# Patient Record
Sex: Female | Born: 1965 | Race: Black or African American | Hispanic: No | Marital: Married | Smoking: Never smoker
Health system: Southern US, Community
[De-identification: ages and names within clinical notes are randomized; demographics above are authoritative.]

## PROBLEM LIST (undated history)

## (undated) DIAGNOSIS — J45909 Unspecified asthma, uncomplicated: Secondary | ICD-10-CM

## (undated) DIAGNOSIS — T7840XA Allergy, unspecified, initial encounter: Secondary | ICD-10-CM

## (undated) HISTORY — DX: Allergy, unspecified, initial encounter: T78.40XA

## (undated) HISTORY — PX: NO PAST SURGERIES: SHX2092

## (undated) HISTORY — DX: Unspecified asthma, uncomplicated: J45.909

---

## 1999-06-17 ENCOUNTER — Other Ambulatory Visit: Admission: RE | Admit: 1999-06-17 | Discharge: 1999-06-17 | Payer: Self-pay | Admitting: Obstetrics & Gynecology

## 1999-09-08 ENCOUNTER — Other Ambulatory Visit: Admission: RE | Admit: 1999-09-08 | Discharge: 1999-09-08 | Payer: Self-pay | Admitting: Obstetrics & Gynecology

## 1999-09-09 ENCOUNTER — Encounter (INDEPENDENT_AMBULATORY_CARE_PROVIDER_SITE_OTHER): Payer: Self-pay

## 1999-09-09 ENCOUNTER — Other Ambulatory Visit: Admission: RE | Admit: 1999-09-09 | Discharge: 1999-09-09 | Payer: Self-pay | Admitting: Obstetrics & Gynecology

## 2000-01-06 ENCOUNTER — Other Ambulatory Visit: Admission: RE | Admit: 2000-01-06 | Discharge: 2000-01-06 | Payer: Self-pay | Admitting: Obstetrics & Gynecology

## 2000-05-19 ENCOUNTER — Other Ambulatory Visit: Admission: RE | Admit: 2000-05-19 | Discharge: 2000-05-19 | Payer: Self-pay | Admitting: Obstetrics & Gynecology

## 2000-12-15 ENCOUNTER — Encounter: Payer: Self-pay | Admitting: Obstetrics and Gynecology

## 2000-12-15 ENCOUNTER — Encounter (INDEPENDENT_AMBULATORY_CARE_PROVIDER_SITE_OTHER): Payer: Self-pay | Admitting: Specialist

## 2000-12-15 ENCOUNTER — Inpatient Hospital Stay (HOSPITAL_COMMUNITY): Admission: EM | Admit: 2000-12-15 | Discharge: 2000-12-18 | Payer: Self-pay | Admitting: Obstetrics & Gynecology

## 2001-01-04 ENCOUNTER — Encounter: Admission: RE | Admit: 2001-01-04 | Discharge: 2001-02-03 | Payer: Self-pay | Admitting: Obstetrics & Gynecology

## 2001-01-16 ENCOUNTER — Other Ambulatory Visit: Admission: RE | Admit: 2001-01-16 | Discharge: 2001-01-16 | Payer: Self-pay | Admitting: Obstetrics & Gynecology

## 2001-03-06 ENCOUNTER — Encounter: Admission: RE | Admit: 2001-03-06 | Discharge: 2001-04-05 | Payer: Self-pay | Admitting: Obstetrics & Gynecology

## 2001-04-06 ENCOUNTER — Encounter: Admission: RE | Admit: 2001-04-06 | Discharge: 2001-05-06 | Payer: Self-pay | Admitting: Obstetrics & Gynecology

## 2001-06-06 ENCOUNTER — Encounter: Admission: RE | Admit: 2001-06-06 | Discharge: 2001-07-06 | Payer: Self-pay | Admitting: Obstetrics & Gynecology

## 2001-08-06 ENCOUNTER — Encounter: Admission: RE | Admit: 2001-08-06 | Discharge: 2001-09-05 | Payer: Self-pay | Admitting: Obstetrics & Gynecology

## 2001-09-06 ENCOUNTER — Encounter: Admission: RE | Admit: 2001-09-06 | Discharge: 2001-10-06 | Payer: Self-pay | Admitting: Obstetrics & Gynecology

## 2001-11-04 ENCOUNTER — Encounter: Admission: RE | Admit: 2001-11-04 | Discharge: 2001-12-04 | Payer: Self-pay | Admitting: Obstetrics & Gynecology

## 2002-01-04 ENCOUNTER — Encounter: Admission: RE | Admit: 2002-01-04 | Discharge: 2002-02-03 | Payer: Self-pay | Admitting: Obstetrics & Gynecology

## 2005-05-03 ENCOUNTER — Encounter: Admission: RE | Admit: 2005-05-03 | Discharge: 2005-05-03 | Payer: Self-pay | Admitting: Specialist

## 2006-02-15 ENCOUNTER — Other Ambulatory Visit: Admission: RE | Admit: 2006-02-15 | Discharge: 2006-02-15 | Payer: Self-pay | Admitting: Family Medicine

## 2007-05-08 ENCOUNTER — Other Ambulatory Visit: Admission: RE | Admit: 2007-05-08 | Discharge: 2007-05-08 | Payer: Self-pay | Admitting: Family Medicine

## 2008-06-03 ENCOUNTER — Other Ambulatory Visit: Admission: RE | Admit: 2008-06-03 | Discharge: 2008-06-03 | Payer: Self-pay | Admitting: Family Medicine

## 2008-06-07 ENCOUNTER — Encounter: Admission: RE | Admit: 2008-06-07 | Discharge: 2008-06-07 | Payer: Self-pay | Admitting: Family Medicine

## 2010-10-05 ENCOUNTER — Other Ambulatory Visit (HOSPITAL_COMMUNITY)
Admission: RE | Admit: 2010-10-05 | Discharge: 2010-10-05 | Disposition: A | Discharge status: Home / Self Care | Payer: BC Managed Care – PPO | Source: Ambulatory Visit | Attending: Family Medicine | Admitting: Family Medicine

## 2010-10-05 DIAGNOSIS — Z0000 Encounter for general adult medical examination without abnormal findings: Secondary | ICD-10-CM | POA: Insufficient documentation

## 2010-12-04 NOTE — H&P (Signed)
[facility]  Patient:    Michelle Whitehead, Michelle Whitehead                           MRN: [id number] Adm. Date:  [id number] Attending:  Maxie Better                         History and Physical  DATE OF BIRTH:                October 20, 1965  INDICATIONS:                  [name] is a 45 year old G3, P2.  Last menstrual period was March 13, 2000 for an expected date of delivery December 19, 2000 at 39 weeks and 4 days gestation.  REASON FOR ADMISSION:         Induction for polyhydramnios at term.  HISTORY OF PRESENT ILLNESS:   Rare uterine contractions, no vaginal bleeding, no fluid leak, good fetal movement, no PIH symptoms.  The patient developed a polyhydramnios, which persisted on ultrasound at 38+ weeks.  Amniotic fluid index was at the 99th percentile.  Estimated fetal weight at the 65th percentile with normal ______.  Presentation was cephalic.  It had been very unstable in the previous weeks.  PAST MEDICAL HISTORY:         Negative.  PAST SURGICAL HISTORY:        Negative.  PAST GYNECOLOGIC HISTORY:     History of abnormal Pap test.  Laser surgery at 45 years old.  PAST OBSTETRICAL HISTORY:     February 1996 42 weeks spontaneous vaginal delivery.  No complications.  In November 1997, pregnancy complicated by polyhydramnios, induced at 36 weeks.  Spontaneous vaginal delivery, no complications.  ALLERGIES:                    No known drug allergies.  MEDICATIONS:                  Prenatal vitamins.  SOCIAL HISTORY:               Married, nonsmoker.  FAMILY HISTORY:               Positive for diabetes.  HISTORY OF PRESENT PREGNANCY: First trimester was normal.  Labs in the first trimester showed hemoglobin at 11.8, platelets 392.  Blood type O+, Rh antibodies negative.  RPR nonreactive, HBsAg negative, HIV nonreactive, rubella immune.  Gonorrhea and chlamydia negative.  Pap test within normal limits.  In the second trimester, triple test at 16+ weeks  was within normal limits.  One-hour GTT was within normal limits.  Ultrasound at 22+ weeks was within normal limits.  Placenta normal.  Amniotic fluid normal.  One-hour GTT repeated at 27-28 weeks was within normal limits.  Ultrasound at 33+ showed amniotic fluid at the 75th percentile.  Estimated fetal weight at the 48th percentile.  Blood pressures remained normal throughout pregnancy.  See HPI for ultrasound later in the third trimester.  REVIEW OF SYSTEMS:            CONSTITUTIONAL:  Negative.  HEENT:  Negative. CARDIOVASCULAR:  Negative.  RESPIRATORY:  Negative.  GI:  Negative.  UROLOGIC: Negative.  ENDOCRINE:  Negative.  NEUROLOGIC:  Negative.  DERMATOLOGIC: Negative.  PHYSICAL EXAMINATION:  GENERAL:  No apparent distress.  VITAL SIGNS:                  Blood pressure 111/69, pulse 99, respiratory rate 20, temperature 97.4.  LUNGS:                        Clear bilaterally.  HEART:                        Regular cardiac rhythm.  No murmur.  ABDOMEN:                      Gravid.  Uterine height 41 cm.  Cephalic presentation confirmed by ultrasound on admission.  PELVIC:                       Vaginal exam:  1+, 50%, cephalic at -3/-4. Membranes intact.  Lower limbs normal.  No clonus.  Monitoring:  No regular uterine contractions.  Fetal heart rate 130 per minute.  Good variability with accelerations, reactive.  No decelerations.  Group B Strep at 35+ weeks negative.  IMPRESSION:                   G3, P2 39+ weeks gestation with polyhydramnios. Fetal well-being reassuring.  Group B Strep negative.  PLAN:                         Admit to labor and delivery for induction and cervical ripening with Cervidil, then Pitocin and artificial of membranes when possible.  Monitoring, expectant management, with probable vaginal delivery. DD:  12/16/00 TD:  12/16/00 Job: [id number]

## 2013-11-12 ENCOUNTER — Other Ambulatory Visit (HOSPITAL_COMMUNITY)
Admission: RE | Admit: 2013-11-12 | Discharge: 2013-11-12 | Disposition: A | Discharge status: Home / Self Care | Payer: BC Managed Care – PPO | Source: Ambulatory Visit | Attending: Family Medicine | Admitting: Family Medicine

## 2013-11-12 DIAGNOSIS — Z01419 Encounter for gynecological examination (general) (routine) without abnormal findings: Secondary | ICD-10-CM | POA: Insufficient documentation

## 2014-07-07 ENCOUNTER — Ambulatory Visit (INDEPENDENT_AMBULATORY_CARE_PROVIDER_SITE_OTHER): Payer: BC Managed Care – PPO

## 2014-07-07 ENCOUNTER — Ambulatory Visit (INDEPENDENT_AMBULATORY_CARE_PROVIDER_SITE_OTHER): Payer: BC Managed Care – PPO | Admitting: Family Medicine

## 2014-07-07 VITALS — BP 122/88 | HR 62 | Temp 98.1°F | Resp 12 | Ht 67.0 in | Wt 231.5 lb

## 2014-07-07 DIAGNOSIS — R079 Chest pain, unspecified: Secondary | ICD-10-CM

## 2014-07-07 LAB — POCT CBC
Granulocyte percent: 45.5 %G (ref 37–80)
HCT, POC: 39.5 % (ref 37.7–47.9)
Hemoglobin: 12.6 g/dL (ref 12.2–16.2)
Lymph, poc: 3.2 (ref 0.6–3.4)
MCH, POC: 27.5 pg (ref 27–31.2)
MCHC: 32 g/dL (ref 31.8–35.4)
MCV: 85.7 fL (ref 80–97)
MID (cbc): 0.3 (ref 0–0.9)
MPV: 7.2 fL (ref 0–99.8)
POC Granulocyte: 2.9 (ref 2–6.9)
POC LYMPH PERCENT: 49.7 %L (ref 10–50)
POC MID %: 4.8 %M (ref 0–12)
Platelet Count, POC: 396 10*3/uL (ref 142–424)
RBC: 4.61 M/uL (ref 4.04–5.48)
RDW, POC: 14.3 %
WBC: 6.4 10*3/uL (ref 4.6–10.2)

## 2014-07-07 LAB — D-DIMER, QUANTITATIVE: D-Dimer, Quant: 0.27 ug/mL-FEU (ref 0.00–0.48)

## 2014-07-07 MED ORDER — HYDROCODONE-ACETAMINOPHEN 5-325 MG PO TABS: 1.0000 | ORAL_TABLET | Freq: Four times a day (QID) | ORAL | Status: DC | PRN

## 2014-07-07 MED ORDER — PREDNISONE 20 MG PO TABS: 40.0000 mg | ORAL_TABLET | Freq: Every day | ORAL | Status: DC

## 2014-07-07 NOTE — Progress Notes (Signed)
This Is a 48 year old woman who presents with new onset chest pain primarily in the left upper chest. She's had no nausea or vomiting, no fever, no cough, no diaphoresis. She's never had pain like this before. She has no nausea or vomiting.  The pain is rather sharp, constant, and made worse by raising her left arm or taking a deep breath. She is not short of breath however. In addition, patient says the pain is somewhat worse when she leans forward.  Objective: No acute distress, very pleasant woman who is interviewed in the presence of her daughter. HEENT: Unremarkable Neck: Supple no adenopathy no JVD Extremities: Good range of motion, no rash Chest: Very tender just below the clavicle in the midclavicular line on the left. She is clear to auscultation  [facility] reading (PRIMARY) by  Dr. Milus GlazierLauenstein:  CXR. Normal Results for orders placed or performed in visit on 07/07/14  POCT CBC  Result Value Ref Range   WBC 6.4 4.6 - 10.2 K/uL   Lymph, poc 3.2 0.6 - 3.4   POC LYMPH PERCENT 49.7 10 - 50 %L   MID (cbc) 0.3 0 - 0.9   POC MID % 4.8 0 - 12 %M   POC Granulocyte 2.9 2 - 6.9   Granulocyte percent 45.5 37 - 80 %G   RBC 4.61 4.04 - 5.48 M/uL   Hemoglobin 12.6 12.2 - 16.2 g/dL   HCT, POC 16.139.5 09.637.7 - 47.9 %   MCV 85.7 80 - 97 fL   MCH, POC 27.5 27 - 31.2 pg   MCHC 32.0 31.8 - 35.4 g/dL   RDW, POC 04.514.3 %   Platelet Count, POC 396 142 - 424 K/uL   MPV 7.2 0 - 99.8 fL    Assessment: Patient has chest wall pain consistent with the irritation of the perforating superficial pectoral nerve on the left upper chest.      ICD-9-CM ICD-10-CM   1. Chest pain, unspecified chest pain type 786.50 R07.9 EKG 12-Lead     DG Chest 2 View     POCT CBC     D-dimer, quantitative     predniSONE (DELTASONE) 20 MG tablet     HYDROcodone-acetaminophen (NORCO) 5-325 MG per tablet     Signed, Elvina SidleKurt Christabella Alvira, MD

## 2014-07-07 NOTE — Patient Instructions (Signed)

## 2016-02-02 ENCOUNTER — Other Ambulatory Visit: Payer: Self-pay | Admitting: Family Medicine

## 2016-02-02 DIAGNOSIS — Z1231 Encounter for screening mammogram for malignant neoplasm of breast: Secondary | ICD-10-CM

## 2016-02-05 ENCOUNTER — Ambulatory Visit
Admission: RE | Admit: 2016-02-05 | Discharge: 2016-02-05 | Disposition: A | Discharge status: Home / Self Care | Payer: BC Managed Care – PPO | Source: Ambulatory Visit | Attending: Family Medicine | Admitting: Family Medicine

## 2016-02-05 DIAGNOSIS — Z1231 Encounter for screening mammogram for malignant neoplasm of breast: Secondary | ICD-10-CM

## 2016-08-31 ENCOUNTER — Ambulatory Visit (INDEPENDENT_AMBULATORY_CARE_PROVIDER_SITE_OTHER): Payer: BC Managed Care – PPO

## 2016-08-31 ENCOUNTER — Ambulatory Visit (INDEPENDENT_AMBULATORY_CARE_PROVIDER_SITE_OTHER): Payer: BC Managed Care – PPO | Admitting: Physician Assistant

## 2016-08-31 VITALS — BP 122/90 | HR 93 | Temp 98.4°F | Resp 18 | Ht 67.0 in | Wt 249.0 lb

## 2016-08-31 DIAGNOSIS — R0989 Other specified symptoms and signs involving the circulatory and respiratory systems: Secondary | ICD-10-CM | POA: Diagnosis not present

## 2016-08-31 DIAGNOSIS — M5413 Radiculopathy, cervicothoracic region: Secondary | ICD-10-CM | POA: Diagnosis not present

## 2016-08-31 DIAGNOSIS — R0789 Other chest pain: Secondary | ICD-10-CM

## 2016-08-31 DIAGNOSIS — Z8709 Personal history of other diseases of the respiratory system: Secondary | ICD-10-CM

## 2016-08-31 DIAGNOSIS — M542 Cervicalgia: Secondary | ICD-10-CM | POA: Diagnosis not present

## 2016-08-31 LAB — POCT CBC
Granulocyte percent: 61.3 %G (ref 37–80)
HEMATOCRIT: 35.7 % — AB (ref 37.7–47.9)
Hemoglobin: 12.4 g/dL (ref 12.2–16.2)
LYMPH, POC: 2.7 (ref 0.6–3.4)
MCH, POC: 28.7 pg (ref 27–31.2)
MCHC: 34.7 g/dL (ref 31.8–35.4)
MCV: 82.7 fL (ref 80–97)
MID (CBC): 0.2 (ref 0–0.9)
MPV: 7.1 fL (ref 0–99.8)
POC Granulocyte: 4.7 (ref 2–6.9)
POC LYMPH PERCENT: 35.9 %L (ref 10–50)
POC MID %: 2.8 % (ref 0–12)
Platelet Count, POC: 355 10*3/uL (ref 142–424)
RBC: 4.32 M/uL (ref 4.04–5.48)
RDW, POC: 13.6 %
WBC: 7.6 10*3/uL (ref 4.6–10.2)

## 2016-08-31 LAB — GLUCOSE, POCT (MANUAL RESULT ENTRY): POC Glucose: 71 mg/dl (ref 70–99)

## 2016-08-31 MED ORDER — IPRATROPIUM BROMIDE 0.02 % IN SOLN
0.5000 mg | Freq: Once | RESPIRATORY_TRACT | Status: AC
  Administered 2016-08-31: 0.5 mg via RESPIRATORY_TRACT

## 2016-08-31 MED ORDER — PREDNISONE 20 MG PO TABS: ORAL_TABLET | ORAL | 0 refills | Status: AC

## 2016-08-31 MED ORDER — ALBUTEROL SULFATE (2.5 MG/3ML) 0.083% IN NEBU
2.5000 mg | INHALATION_SOLUTION | Freq: Once | RESPIRATORY_TRACT | Status: AC
  Administered 2016-08-31: 2.5 mg via RESPIRATORY_TRACT

## 2016-08-31 MED ORDER — ALBUTEROL SULFATE HFA 108 (90 BASE) MCG/ACT IN AERS: 2.0000 | INHALATION_SPRAY | Freq: Four times a day (QID) | RESPIRATORY_TRACT | 2 refills | Status: DC | PRN

## 2016-08-31 NOTE — Patient Instructions (Signed)
     IF you received an x-ray today, you will receive an invoice from [facility]. Please contact [facility] at [phone] with questions or concerns regarding your invoice.   IF you received labwork today, you will receive an invoice from [facility]. Please contact [facility] at [phone] with questions or concerns regarding your invoice.   Our billing staff will not be able to assist you with questions regarding bills from these companies.  You will be contacted with the lab results as soon as they are available. The fastest way to get your results is to activate your My Chart account. Instructions are located on the last page of this paperwork. If you have not heard from us regarding the results in 2 weeks, please contact this office.     

## 2016-08-31 NOTE — Progress Notes (Signed)
08/31/2016 4:24 PM   DOB: 01-20-1966 / MRN: [id number]  SUBJECTIVE:  Michelle Whitehead is a 51 y.o. female presenting for sharp chest and shoulder pain that started yesterday.  She also complains of pain going down the left arm. She has a history of asthma.  There is no exertional component to the pain.  Mother is healthy. She is a never smoker, is non diabetic, denies a history of HTN.  Her father died of an MI at 70 however her father was a smoker.   She has No Known Allergies.   She  has a past medical history of Allergy and Asthma.    She  reports that she has never smoked. She has never used smokeless tobacco. She reports that she drinks alcohol. She reports that she does not use drugs. She  has no sexual activity history on file. The patient  has no past surgical history on file.  Her family history includes Diabetes in her father and paternal grandmother; Heart disease in her father and maternal grandfather; Hypertension in her father, mother, and paternal grandmother.  Review of Systems  Constitutional: Negative for diaphoresis and fever.  Respiratory: Positive for cough and shortness of breath. Negative for hemoptysis, sputum production and wheezing.   Cardiovascular: Positive for chest pain and leg swelling.  Gastrointestinal: Negative for nausea and vomiting.  Genitourinary: Negative for dysuria, frequency and urgency.  Musculoskeletal: Positive for neck pain.  Neurological: Negative for dizziness.    The problem list and medications were reviewed and updated by myself where necessary and exist elsewhere in the encounter.   OBJECTIVE:  BP 122/90   Pulse 93   Temp 98.4 F (36.9 C) (Oral)   Resp 18   Ht 5\' 7"  (1.702 m)   Wt 249 lb (112.9 kg)   SpO2 98%   BMI 39.00 kg/m   Physical Exam  Constitutional: She is oriented to person, place, and time. Vital signs are normal.  Non-toxic appearance. She does not have a sickly appearance. She does not appear ill. No distress.   Cardiovascular: Normal rate, regular rhythm and normal heart sounds.  Exam reveals no gallop and no friction rub.   No murmur heard. Pulmonary/Chest: Effort normal and breath sounds normal. No respiratory distress. She has no wheezes. She has no rales. She exhibits no tenderness.  Abdominal: Soft. Bowel sounds are normal.  Musculoskeletal: Normal range of motion. She exhibits no edema or tenderness.  Neurological: She is alert and oriented to person, place, and time.  Skin: Skin is warm and dry.      Results for orders placed or performed in visit on 08/31/16 (from the past 72 hour(s))  POCT glucose (manual entry)     Status: None   Collection Time: 08/31/16  3:04 PM  Result Value Ref Range   POC Glucose 71 70 - 99 mg/dl  POCT CBC     Status: Abnormal   Collection Time: 08/31/16  3:05 PM  Result Value Ref Range   WBC 7.6 4.6 - 10.2 K/uL   Lymph, poc 2.7 0.6 - 3.4   POC LYMPH PERCENT 35.9 10 - 50 %L   MID (cbc) 0.2 0 - 0.9   POC MID % 2.8 0 - 12 %M   POC Granulocyte 4.7 2 - 6.9   Granulocyte percent 61.3 37 - 80 %G   RBC 4.32 4.04 - 5.48 M/uL   Hemoglobin 12.4 12.2 - 16.2 g/dL   HCT, POC 40.9 (A) 81.1 - 47.9 %  MCV 82.7 80 - 97 fL   MCH, POC 28.7 27 - 31.2 pg   MCHC 34.7 31.8 - 35.4 g/dL   RDW, POC 47.8 %   Platelet Count, POC 355 142 - 424 K/uL   MPV 7.1 0 - 99.8 fL    Dg Chest 2 View  Result Date: 08/31/2016 CLINICAL DATA:  Lambert Mody left-sided chest pain radiating to the left shoulder and arm. History of asthma. EXAM: CHEST  2 VIEW COMPARISON:  Chest x-ray of July 07, 2014 FINDINGS: The lungs are adequately inflated. There is no focal infiltrate. The interstitial markings are slightly more conspicuous overall today. The heart is normal in size. The pulmonary vascularity is not engorged. The trachea is midline. The bony thorax exhibits no acute abnormality. IMPRESSION: There is no alveolar pneumonia nor CHF. Mild interstitial prominence is noted bilaterally and may reflect  bronchitic or asthmatic change in the appropriate clinical setting. Electronically Signed   By: David  [place] M.D.   On: 08/31/2016 15:26   Dg Cervical Spine Complete  Result Date: 08/31/2016 CLINICAL DATA:  Left neck pain radiating into the left shoulder arm. No known injury. Symptoms are chronic. EXAM: CERVICAL SPINE - COMPLETE 4+ VIEW COMPARISON:  None. FINDINGS: No acute abnormality is identified. Mild reversal of the normal cervical lordosis is seen. Intervertebral disc space height is maintained. Anterior endplate spurring is most notable at C4-5 and C5-6. Facet joints are unremarkable. Prevertebral soft tissues appear normal. IMPRESSION: No acute abnormality. 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 Electronically Signed   By: Drusilla Kanner M.D.   On: 08/31/2016 15:25    ASSESSMENT AND PLAN:  Emil was seen today for chest pain.  Diagnoses and all orders for this visit:  Other chest pain: Non cardiac. EKG and chest normal.  Labs are pending. Chest pain improved with nebs.  Cervical rads consistent with problem 4.  Will treat for problem 4 and five with pred.  She is not a diabetic. Refilling her inhaler.  -     EKG 12-Lead -     POCT glucose (manual entry) -     DG Chest 2 View; Future -     POCT CBC -     Brain natriuretic peptide -     albuterol (PROVENTIL) (2.5 MG/3ML) 0.083% nebulizer solution 2.5 mg; Take 3 mLs (2.5 mg total) by nebulization once. -     ipratropium (ATROVENT) nebulizer solution 0.5 mg; Take 2.5 mLs (0.5 mg total) by nebulization once. -     Basic metabolic panel  Cervical pain (neck) -     DG Cervical Spine Complete; Future  History of asthma Comments: She is out of her inhaler.  Chest pain improved with nebs times one.  Peak flow from 375 to 550. Will start reorder inhaler and start pred.  Orders: -     albuterol (PROVENTIL HFA;VENTOLIN HFA) 108 (90 Base) MCG/ACT inhaler; Inhale 2 puffs into the lungs every 6 (six) hours as needed for wheezing or  shortness of breath.  Radiculopathy of cervicothoracic region -     predniSONE (DELTASONE) 20 MG tablet; Take 3 in the morning for 3 days, then 2 in the morning for 3 days, and then 1 in the morning for 3 days.  Abnormally low peak expiratory flow rate -     predniSONE (DELTASONE) 20 MG tablet; Take 3 in the morning for 3 days, then 2 in the morning for 3 days, and then 1 in the morning for 3 days.  The patient is advised to call or return to clinic if she does not see an improvement in symptoms, or to seek the care of the closest emergency department if she worsens with the above plan.   Deliah BostonMichael Sundus Pete, MHS, PA-C [facility] 08/31/2016 4:24 PM

## 2016-09-01 LAB — BRAIN NATRIURETIC PEPTIDE: BNP: 5 pg/mL (ref 0.0–100.0)

## 2016-09-01 LAB — BASIC METABOLIC PANEL
BUN/Creatinine Ratio: 19 (ref 9–23)
BUN: 13 mg/dL (ref 6–24)
CALCIUM: 9.1 mg/dL (ref 8.7–10.2)
CHLORIDE: 104 mmol/L (ref 96–106)
CO2: 22 mmol/L (ref 18–29)
Creatinine, Ser: 0.68 mg/dL (ref 0.57–1.00)
GFR calc Af Amer: 120 mL/min/{1.73_m2} (ref >59)
GFR calc non Af Amer: 104 mL/min/{1.73_m2} (ref >59)
Glucose: 84 mg/dL (ref 65–99)
POTASSIUM: 4.1 mmol/L (ref 3.5–5.2)
Sodium: 145 mmol/L — ABNORMAL HIGH (ref 134–144)

## 2018-02-03 ENCOUNTER — Other Ambulatory Visit (HOSPITAL_COMMUNITY)
Admission: RE | Admit: 2018-02-03 | Discharge: 2018-02-03 | Disposition: A | Discharge status: Home / Self Care | Payer: BC Managed Care – PPO | Source: Ambulatory Visit | Attending: Family Medicine | Admitting: Family Medicine

## 2018-02-03 ENCOUNTER — Other Ambulatory Visit: Payer: Self-pay | Admitting: Family Medicine

## 2018-02-03 DIAGNOSIS — Z0000 Encounter for general adult medical examination without abnormal findings: Secondary | ICD-10-CM | POA: Insufficient documentation

## 2018-02-07 LAB — CYTOLOGY - PAP
DIAGNOSIS: NEGATIVE
HPV (WINDOPATH): NOT DETECTED

## 2018-03-11 IMAGING — DX DG CHEST 2V
2 series · 2 of 2 positions shown · non-contrast
Comparison: Chest x-ray of July 07, 2014

CLINICAL DATA: Sharp left-sided chest pain radiating to the left
shoulder and arm. History of asthma.

EXAM:
CHEST  2 VIEW

[chest pa]
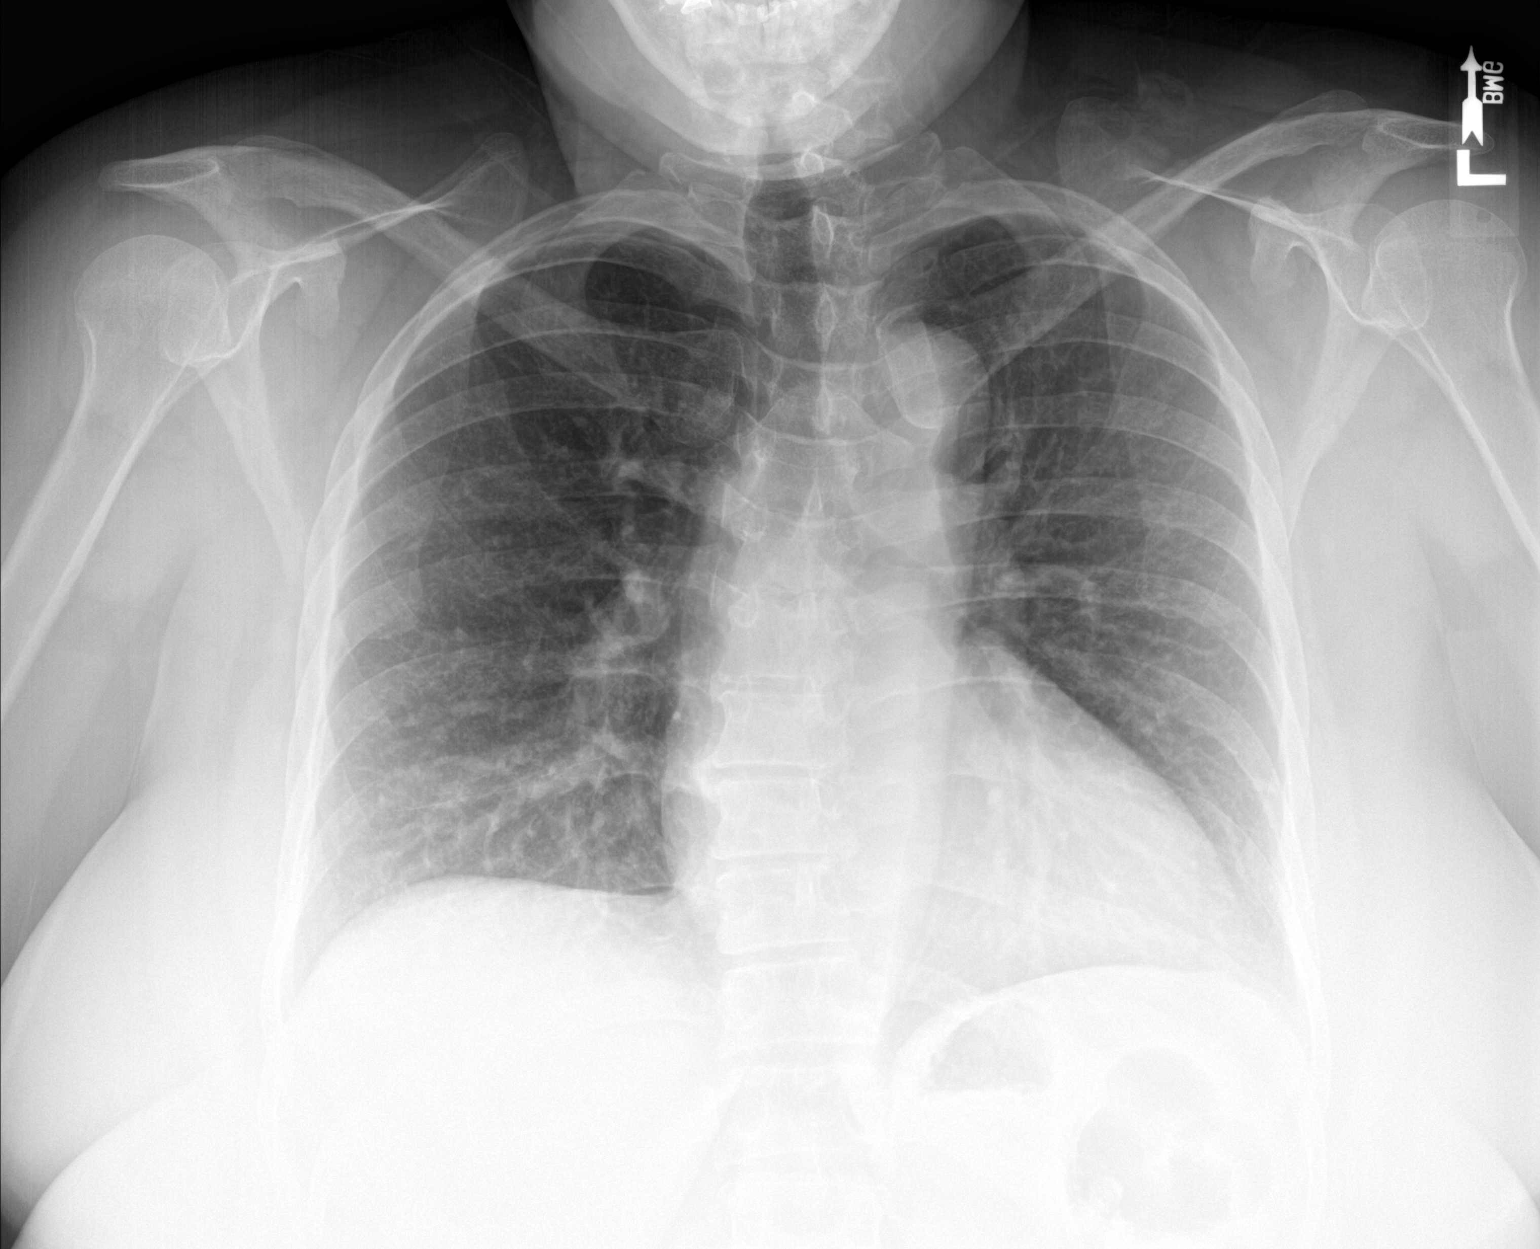

[chest lat]
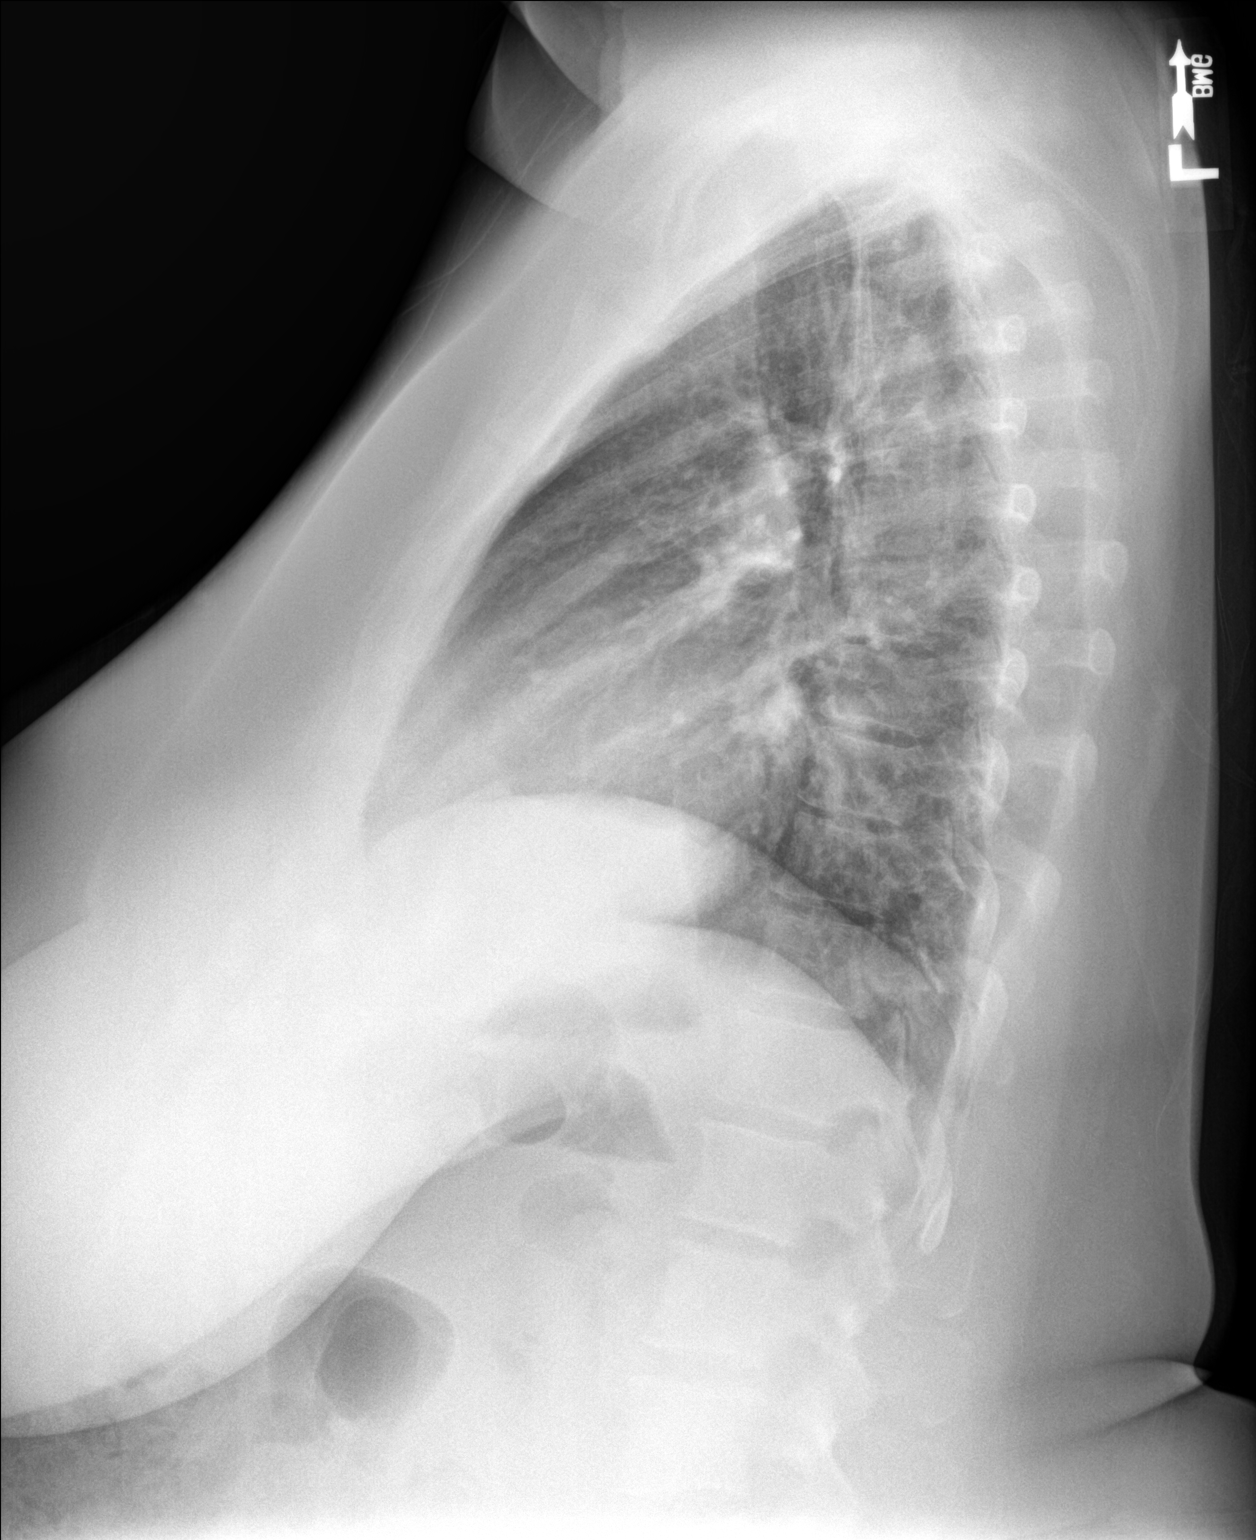

[2 of 2 positions shown; findings below may reference images not displayed]

FINDINGS: The lungs are adequately inflated. There is no focal infiltrate. The
interstitial markings are slightly more conspicuous overall today.
The heart is normal in size. The pulmonary vascularity is not
engorged. The trachea is midline. The bony thorax exhibits no acute
abnormality.
IMPRESSION: There is no alveolar pneumonia nor CHF. Mild interstitial prominence
is noted bilaterally and may reflect bronchitic or asthmatic change
in the appropriate clinical setting.

## 2018-12-21 ENCOUNTER — Other Ambulatory Visit: Payer: Self-pay | Admitting: Family Medicine

## 2018-12-21 DIAGNOSIS — Z1231 Encounter for screening mammogram for malignant neoplasm of breast: Secondary | ICD-10-CM

## 2019-02-09 ENCOUNTER — Ambulatory Visit
Admission: RE | Admit: 2019-02-09 | Discharge: 2019-02-09 | Disposition: A | Discharge status: Home / Self Care | Payer: BC Managed Care – PPO | Source: Ambulatory Visit | Attending: Family Medicine | Admitting: Family Medicine

## 2019-02-09 ENCOUNTER — Other Ambulatory Visit: Payer: Self-pay

## 2019-02-09 DIAGNOSIS — Z1231 Encounter for screening mammogram for malignant neoplasm of breast: Secondary | ICD-10-CM

## 2019-06-21 ENCOUNTER — Other Ambulatory Visit: Payer: Self-pay | Admitting: Cardiology

## 2019-06-21 DIAGNOSIS — Z20822 Contact with and (suspected) exposure to covid-19: Secondary | ICD-10-CM

## 2019-06-24 LAB — NOVEL CORONAVIRUS, NAA: SARS-CoV-2, NAA: NOT DETECTED

## 2019-09-19 ENCOUNTER — Emergency Department (HOSPITAL_COMMUNITY): Payer: BC Managed Care – PPO

## 2019-09-19 ENCOUNTER — Inpatient Hospital Stay (HOSPITAL_COMMUNITY)
Admission: EM | Admit: 2019-09-19 | Discharge: 2019-09-21 | DRG: 177 | Disposition: A | Discharge status: Home / Self Care | Payer: BC Managed Care – PPO | Attending: Internal Medicine | Admitting: Internal Medicine

## 2019-09-19 ENCOUNTER — Other Ambulatory Visit: Payer: Self-pay

## 2019-09-19 ENCOUNTER — Encounter (HOSPITAL_COMMUNITY): Payer: Self-pay | Admitting: Emergency Medicine

## 2019-09-19 DIAGNOSIS — J9601 Acute respiratory failure with hypoxia: Secondary | ICD-10-CM | POA: Diagnosis present

## 2019-09-19 DIAGNOSIS — J45909 Unspecified asthma, uncomplicated: Secondary | ICD-10-CM

## 2019-09-19 DIAGNOSIS — U071 COVID-19: Principal | ICD-10-CM | POA: Diagnosis present

## 2019-09-19 DIAGNOSIS — E876 Hypokalemia: Secondary | ICD-10-CM | POA: Diagnosis present

## 2019-09-19 DIAGNOSIS — Z8249 Family history of ischemic heart disease and other diseases of the circulatory system: Secondary | ICD-10-CM

## 2019-09-19 DIAGNOSIS — J1282 Pneumonia due to coronavirus disease 2019: Secondary | ICD-10-CM | POA: Diagnosis present

## 2019-09-19 DIAGNOSIS — J189 Pneumonia, unspecified organism: Secondary | ICD-10-CM

## 2019-09-19 DIAGNOSIS — Z833 Family history of diabetes mellitus: Secondary | ICD-10-CM

## 2019-09-19 DIAGNOSIS — J188 Other pneumonia, unspecified organism: Secondary | ICD-10-CM | POA: Diagnosis present

## 2019-09-19 DIAGNOSIS — Z20822 Contact with and (suspected) exposure to covid-19: Secondary | ICD-10-CM

## 2019-09-19 LAB — COMPREHENSIVE METABOLIC PANEL
ALT: 52 U/L — ABNORMAL HIGH (ref 0–44)
AST: 72 U/L — ABNORMAL HIGH (ref 15–41)
Albumin: 2.9 g/dL — ABNORMAL LOW (ref 3.5–5.0)
Alkaline Phosphatase: 83 U/L (ref 38–126)
Anion gap: 11 (ref 5–15)
BUN: 11 mg/dL (ref 6–20)
CO2: 23 mmol/L (ref 22–32)
Calcium: 8.7 mg/dL — ABNORMAL LOW (ref 8.9–10.3)
Chloride: 105 mmol/L (ref 98–111)
Creatinine, Ser: 0.7 mg/dL (ref 0.44–1.00)
GFR calc Af Amer: >60 mL/min (ref >60)
GFR calc non Af Amer: >60 mL/min (ref >60)
Glucose, Bld: 119 mg/dL — ABNORMAL HIGH (ref 70–99)
Potassium: 3.4 mmol/L — ABNORMAL LOW (ref 3.5–5.1)
Sodium: 139 mmol/L (ref 135–145)
Total Bilirubin: 0.7 mg/dL (ref 0.3–1.2)
Total Protein: 7.3 g/dL (ref 6.5–8.1)

## 2019-09-19 LAB — CBC WITH DIFFERENTIAL/PLATELET
Abs Immature Granulocytes: 0.04 10*3/uL (ref 0.00–0.07)
Basophils Absolute: 0 10*3/uL (ref 0.0–0.1)
Basophils Relative: 0 %
Eosinophils Absolute: 0 10*3/uL (ref 0.0–0.5)
Eosinophils Relative: 0 %
HCT: 39.4 % (ref 36.0–46.0)
Hemoglobin: 12.2 g/dL (ref 12.0–15.0)
Immature Granulocytes: 1 %
Lymphocytes Relative: 18 %
Lymphs Abs: 1.3 10*3/uL (ref 0.7–4.0)
MCH: 27.2 pg (ref 26.0–34.0)
MCHC: 31 g/dL (ref 30.0–36.0)
MCV: 87.9 fL (ref 80.0–100.0)
Monocytes Absolute: 0.4 10*3/uL (ref 0.1–1.0)
Monocytes Relative: 5 %
Neutro Abs: 5.6 10*3/uL (ref 1.7–7.7)
Neutrophils Relative %: 76 %
Platelets: 311 10*3/uL (ref 150–400)
RBC: 4.48 MIL/uL (ref 3.87–5.11)
RDW: 13.2 % (ref 11.5–15.5)
WBC: 7.4 10*3/uL (ref 4.0–10.5)
nRBC: 0 % (ref 0.0–0.2)

## 2019-09-19 LAB — FERRITIN: Ferritin: 373 ng/mL — ABNORMAL HIGH (ref 11–307)

## 2019-09-19 LAB — LACTIC ACID, PLASMA: Lactic Acid, Venous: 1 mmol/L (ref 0.5–1.9)

## 2019-09-19 LAB — TRIGLYCERIDES: Triglycerides: 84 mg/dL (ref <150)

## 2019-09-19 LAB — TROPONIN I (HIGH SENSITIVITY)
Troponin I (High Sensitivity): 9 ng/L (ref <18)
Troponin I (High Sensitivity): 11 ng/L (ref <18)

## 2019-09-19 LAB — FIBRINOGEN: Fibrinogen: 736 mg/dL — ABNORMAL HIGH (ref 210–475)

## 2019-09-19 LAB — SARS CORONAVIRUS 2 (TAT 6-24 HRS): SARS Coronavirus 2: POSITIVE — AB

## 2019-09-19 LAB — D-DIMER, QUANTITATIVE: D-Dimer, Quant: 0.63 ug/mL-FEU — ABNORMAL HIGH (ref 0.00–0.50)

## 2019-09-19 LAB — C-REACTIVE PROTEIN: CRP: 16.5 mg/dL — ABNORMAL HIGH (ref <1.0)

## 2019-09-19 LAB — LACTATE DEHYDROGENASE: LDH: 353 U/L — ABNORMAL HIGH (ref 98–192)

## 2019-09-19 LAB — PROCALCITONIN: Procalcitonin: <0.1 ng/mL

## 2019-09-19 MED ORDER — ALBUTEROL SULFATE HFA 108 (90 BASE) MCG/ACT IN AERS
2.0000 | INHALATION_SPRAY | RESPIRATORY_TRACT | Status: DC | PRN
  Filled 2019-09-19: qty 6.7

## 2019-09-19 MED ORDER — GUAIFENESIN-DM 100-10 MG/5ML PO SYRP: 10.0000 mL | ORAL_SOLUTION | ORAL | Status: DC | PRN

## 2019-09-19 MED ORDER — ACETAMINOPHEN 325 MG PO TABS
650.0000 mg | ORAL_TABLET | Freq: Four times a day (QID) | ORAL | Status: DC | PRN
  Administered 2019-09-19: 650 mg via ORAL
  Filled 2019-09-19: qty 2

## 2019-09-19 MED ORDER — HYDROCOD POLST-CPM POLST ER 10-8 MG/5ML PO SUER: 5.0000 mL | Freq: Two times a day (BID) | ORAL | Status: DC | PRN

## 2019-09-19 MED ORDER — SODIUM CHLORIDE 0.9 % IV SOLN
100.0000 mg | Freq: Every day | INTRAVENOUS | Status: DC
  Administered 2019-09-20 – 2019-09-21 (×2): 100 mg via INTRAVENOUS
  Filled 2019-09-19 (×2): qty 20

## 2019-09-19 MED ORDER — SODIUM CHLORIDE 0.9 % IV SOLN
100.0000 mg | INTRAVENOUS | Status: AC
  Administered 2019-09-19 (×2): 100 mg via INTRAVENOUS
  Filled 2019-09-19 (×2): qty 20

## 2019-09-19 MED ORDER — ALBUTEROL SULFATE HFA 108 (90 BASE) MCG/ACT IN AERS
4.0000 | INHALATION_SPRAY | Freq: Once | RESPIRATORY_TRACT | Status: AC
  Administered 2019-09-19: 4 via RESPIRATORY_TRACT
  Filled 2019-09-19: qty 6.7

## 2019-09-19 MED ORDER — ENOXAPARIN SODIUM 40 MG/0.4ML ~~LOC~~ SOLN
40.0000 mg | SUBCUTANEOUS | Status: DC
  Administered 2019-09-19 – 2019-09-20 (×2): 40 mg via SUBCUTANEOUS
  Filled 2019-09-19 (×2): qty 0.4

## 2019-09-19 MED ORDER — DEXAMETHASONE SODIUM PHOSPHATE 10 MG/ML IJ SOLN
6.0000 mg | INTRAMUSCULAR | Status: DC
  Administered 2019-09-19 – 2019-09-20 (×2): 6 mg via INTRAVENOUS
  Filled 2019-09-19 (×2): qty 1

## 2019-09-19 MED ORDER — ALBUTEROL SULFATE HFA 108 (90 BASE) MCG/ACT IN AERS
3.0000 | INHALATION_SPRAY | Freq: Once | RESPIRATORY_TRACT | Status: DC
  Filled 2019-09-19: qty 6.7

## 2019-09-19 NOTE — ED Notes (Signed)
ED Provider at bedside. 

## 2019-09-19 NOTE — ED Notes (Signed)
Previous lactic acid normal

## 2019-09-19 NOTE — H&P (Signed)
Date: 09/19/2019               Patient Name:  Michelle Whitehead MRN: [id number]  DOB: 1966/06/04 Age / Sex: 54 y.o., female   PCP: Patient, No Pcp Per         Medical Service: Internal Medicine Teaching Service         Attending Physician: Dr. Lucious Groves, DO    First Contact: Dr. Ladona Horns Pager: [phone]  Second Contact: Dr. Molli Hazard Pager: [phone]       After Hours (After 5p/  First Contact Pager: [phone]  weekends / holidays): Second Contact Pager: [phone]   Chief Complaint: shortness of breath  History of Present Illness:  Michelle Whitehead is a 54 year old F with significant PMH of asthma, who presents for a 3 day history of shortness of breath, cough, and chest pain. Pt states her symptoms have been progressing since their onset. She has been having fevers at home, T max 101. Her shortness of breath is associated with congestion, sore throat, mild thick yellow sputum production, nausea/vomiting, abdominal soreness. Pt states her chest pain only occurs when she is coughing. Denies loss of taste or smell, diarrhea, or ear pain. She took tylenol at home, which did not improve her symptoms. Of note, pt's husband and daughter tested positive for COVID-19 last week. Pt had a negative antigen test on 2/23.  She presented to [facility] urgent care today for her symptoms. Pulse ox there read at 86%, though provider questioned the reliability of that reading due to her fingernail polish. EMS was called given pt's chest pain, tachycardia, hypoxia, and fever. On arrival, EMS reported a fever of 100.8 and O2 saturation of 91% on room air. They administered 49m ondasetron, 325 ASA, and 700cc NS.  In the ED, pt was afebrile, HR 111, RR 23, BP 137/78, and O2 saturation 94% on room air. CXR demonstrated bilateral patchy interstitial and alveolar airspace opacities concerning for multilobar pneumonia. Labs showed CBC within normal limitis, normal lactate, and normal troponin x2. CMP with K 3.4, bicarb  23, glc 119, BUN 11, Cr 0.7, AST 72, ALT 52, alk phos 83, and t bili 0.7. She was given 4 puffs albuterol. Pt's O2 saturation dropped to 90% while talking so she was placed on 2L [place]. COVID PCR pending. IMTS called for admission.  Meds: No current facility-administered medications on file prior to encounter.   Current Outpatient Medications on File Prior to Encounter  Medication Sig Dispense Refill  . ibuprofen (ADVIL) 200 MG tablet Take 400-600 mg by mouth every 6 (six) hours as needed for moderate pain.    .Marland Kitchenalbuterol (PROVENTIL HFA;VENTOLIN HFA) 108 (90 Base) MCG/ACT inhaler Inhale 2 puffs into the lungs every 6 (six) hours as needed for wheezing or shortness of breath. 1 Inhaler 2  . HYDROcodone-acetaminophen (NORCO) 5-325 MG per tablet Take 1 tablet by mouth every 6 (six) hours as needed for moderate pain. (Patient not taking: Reported on 09/19/2019) 12 tablet 0   Allergies: Allergies as of 09/19/2019  . (No Known Allergies)   Past Medical History:  Diagnosis Date  . Allergy   . Asthma    Family History:  Mother, father, and sibling with hypertension. Father, paternal grandmother, and paternal uncles with diabetes. Father and paternal grandmother with high cholesterol. Pt not aware of any family history of heart diease, asthma, or COPD.  Social History:  [place], and son  and daughter (ages 48 and 59). 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 Never smoker. Drinks wine socially approximately once per month.   Review of Systems: Review of Systems  Constitutional: Positive for chills and fever.  HENT: Positive for congestion, sinus pain and sore throat. Negative for ear pain.   Eyes: Negative for pain.  Respiratory: Positive for cough, sputum production and shortness of breath.   Cardiovascular: Positive for chest pain. Negative for palpitations.  Gastrointestinal: Positive for abdominal pain, nausea and vomiting. Negative for  constipation and diarrhea.  Genitourinary: Negative for dysuria and frequency.  Musculoskeletal: Negative for back pain and myalgias.  Skin: Negative for rash.  Neurological: Negative for dizziness, focal weakness and headaches.   Physical Exam: Blood pressure 134/84, pulse 100, temperature 99.5 F (37.5 C), resp. rate (!) 26, SpO2 97 %. Physical Exam Vitals and nursing note reviewed.  Constitutional:      General: She is not in acute distress.    Appearance: She is obese. She is not ill-appearing.     Comments: Pt resting comfortably in bed, coughing frequently throughout examination.  HENT:     Head: Normocephalic and atraumatic.  Eyes:     Conjunctiva/sclera: Conjunctivae normal.  Cardiovascular:     Rate and Rhythm: Normal rate and regular rhythm.     Heart sounds: Normal heart sounds. No murmur. No friction rub. No gallop.   Pulmonary:     Effort: Pulmonary effort is normal. Tachypnea present.     Comments: On 2L  with O2 saturation >96%. Diffuse rhonchi throughout bilateral lung fields. Pt having difficulty taking deep breaths due to cough, but no wheezing appreciated. Abdominal:     General: Abdomen is flat. Bowel sounds are normal. There is no distension.     Palpations: Abdomen is soft.     Tenderness: There is no abdominal tenderness. There is no guarding or rebound.  Musculoskeletal:        General: Normal range of motion.     Right lower leg: No edema.     Left lower leg: No edema.  Skin:    General: Skin is warm and dry.  Neurological:     General: No focal deficit present.     Mental Status: She is alert and oriented to person, place, and time. Mental status is at baseline.     Comments: Moving all extremities spontaneously in the bed.  Psychiatric:        Mood and Affect: Mood normal.    EKG: personally reviewed my interpretation is sinus tachycardia  CXR: personally reviewed my interpretation is bilateral patchy infiltrates, without effusion,  pneumothorax, or bony abnormality  Assessment & Plan by Problem: Active Problems:   Multifocal pneumonia  Michelle Whitehead is a 54 year old F with significant PMH of asthma, who presents for a 3 day history of shortness of breath, cough, and pleuritic chest pain and was found to have bilateral patchy opacities concerning for multilobar pneumonia due to potential COVID-19 infection.  Multifocal pneumonia Suspected COVID-19 infection Pt presenting after 3 days of fever, shortness of breath, and cough after multiple known COVID exposures at home. CXR showing bilateral patchy interstitial and alveolar airspace opacities concerning for multilobar pneumonia. Pt with a history of asthma, not needed any home inhalers for the last several years.  - currently on 2L given desaturation to 90% when talking - COVID PCR pending - will begin 7 day course of dexamethasone - hold off on remesivir for now  given no positive COVID test yet - instruct pt to self-prone  - hold any anti-inflammatories - continue supplemental O2 to maintain saturations >92% - continuous pulse ox  Diet - regular Fluids - none DVT ppx - enoxaparin 4m subQ daily CODE STATUS - FULL CODE  Dispo: Admit patient to Inpatient with expected length of stay greater than 2 midnights.  Signed: JLadona Horns MD 09/19/2019, 5:06 PM  Pager: [phone]

## 2019-09-19 NOTE — ED Notes (Signed)
Report given to 5W RN. All questions answered 

## 2019-09-19 NOTE — Progress Notes (Signed)
Michelle Whitehead [id number] Admission Data: 09/19/2019 9:11 PM Attending Provider: Gust Rung, DO  HGD:[id number], No Pcp Per Consults/ Treatment Team:   [name] is a 54 y.o. female patient admitted from ED awake, alert  & orientated  X 3,  Full Code, VSS - Blood pressure (!) 147/85, pulse (!) 106, temperature (!) 101.2 F (38.4 C), resp. rate (!) 35, height 5\' 6"  (1.676 m), weight 93.7 kg, SpO2 98 %., O2    2L nasal cannular, c/o shortness of breath, no c/o chest pain, no distress noted. Tele # [id number] placed and pt is currently running:sinus tachycardia.   IV site WDL:  antecubital left, condition patent and no redness with a transparent dsg that's clean dry and intact.  Allergies:  No Known Allergies   Past Medical History:  Diagnosis Date  . Allergy   . Asthma    Pt orientation to unit, room and routine.   Admission INP armband ID verified with patient/family, and in place. SR up x 2, fall risk assessment complete with Patient and family verbalizing understanding of risks associated with falls. Pt verbalizes an understanding of how to use the call bell and to call for help before getting out of bed.  Skin, clean-dry- intact without evidence of bruising, or skin tears.   No evidence of skin break down noted on exam. no rashes, no ecchymoses, no wounds  Will cont to monitor and assist as needed.  , RN 09/19/2019 9:11 PM

## 2019-09-19 NOTE — ED Provider Notes (Signed)
[facility] EMERGENCY DEPARTMENT Provider Note   CSN: [id number] Arrival date & time: 09/19/19  1011     History Chief Complaint  Patient presents with  . Shortness of Breath  . Chest Pain  . Cough    Michelle Whitehead is a 54 y.o. female with history of asthma who presents with SOB, CP, and a cough. Pt states that her symptoms started 3-4 days ago. There are 2 people in her home that tested positive for COVID but her test came back negative. She reports a fever at home of 101, dry cough, worsening SOB. She is also having left sided chest pain which she describes as a pressure. It's worse with coughing and has also been ongoing for 3-4 days. She also has had N/V. She denies URI symptoms, anosmia, headache, diarrhea. She has been having abdominal pain which she describes as a discomfort and is not localized. She went to [facility] today because symptoms were worse and they advised to come to the ED due to complaints of chest pain and low O2 sats (86%). Rapid COVID test was repeated today and was negative again. EMS gave ASA, fluids, zofran and she reports CP is better. O2 sats were noted to be at 91% on RA. She denies history of lung or heart disease. She does not smoke. She has an inhaler but has not been using it.   HPI     Past Medical History:  Diagnosis Date  . Allergy   . Asthma     There are no problems to display for this patient.   History reviewed. No pertinent surgical history.   OB History   No obstetric history on file.     Family History  Problem Relation Age of Onset  . Hypertension Mother   . Hypertension Father   . Heart disease Father   . Diabetes Father   . Heart disease Maternal Grandfather   . Hypertension Paternal Grandmother   . Diabetes Paternal Grandmother     Social History   Tobacco Use  . Smoking status: Never Smoker  . Smokeless tobacco: Never Used  Substance Use Topics  . Alcohol use: Yes    Alcohol/week: 0.0 standard  drinks  . Drug use: No    Home Medications Prior to Admission medications   Medication Sig Start Date End Date Taking? Authorizing Provider  albuterol (PROVENTIL HFA;VENTOLIN HFA) 108 (90 Base) MCG/ACT inhaler Inhale 2 puffs into the lungs every 6 (six) hours as needed for wheezing or shortness of breath. 08/31/16   Ofilia Neas, PA-C  HYDROcodone-acetaminophen (NORCO) 5-325 MG per tablet Take 1 tablet by mouth every 6 (six) hours as needed for moderate pain. 07/07/14   Elvina Sidle, MD    Allergies    Patient has no known allergies.  Review of Systems   Review of Systems  Constitutional: Positive for activity change, appetite change, fatigue and fever.  HENT: Positive for congestion. Negative for rhinorrhea and sore throat.   Respiratory: Positive for cough and shortness of breath.   Cardiovascular: Positive for chest pain. Negative for palpitations and leg swelling.  Gastrointestinal: Positive for abdominal pain, nausea and vomiting.  Neurological: Negative for headaches.  All other systems reviewed and are negative.   Physical Exam Updated Vital Signs BP 137/78   Pulse (!) 111   Temp 99.5 F (37.5 C)   Resp (!) 23   SpO2 94%   Physical Exam Vitals and nursing note reviewed.  Constitutional:  General: She is not in acute distress.    Appearance: She is well-developed. She is obese. She is not ill-appearing.     Comments: Calm and cooperative. Mildly ill appearing  HENT:     Head: Normocephalic and atraumatic.  Eyes:     General: No scleral icterus.       Right eye: No discharge.        Left eye: No discharge.     Conjunctiva/sclera: Conjunctivae normal.     Pupils: Pupils are equal, round, and reactive to light.  Cardiovascular:     Rate and Rhythm: Normal rate.  Pulmonary:     Effort: Pulmonary effort is normal. Tachypnea present. No respiratory distress.     Breath sounds: No decreased breath sounds, wheezing, rhonchi or rales.  Chest:     Chest  wall: No tenderness.  Abdominal:     General: There is no distension.     Palpations: Abdomen is soft.     Tenderness: There is no abdominal tenderness.  Musculoskeletal:     Cervical back: Normal range of motion.     Right lower leg: No edema.     Left lower leg: No edema.  Skin:    General: Skin is warm and dry.  Neurological:     Mental Status: She is alert and oriented to person, place, and time.  Psychiatric:        Behavior: Behavior normal.     ED Results / Procedures / Treatments   Labs (all labs ordered are listed, but only abnormal results are displayed) Labs Reviewed  COMPREHENSIVE METABOLIC PANEL - Abnormal; Notable for the following components:      Result Value   Potassium 3.4 (*)    Glucose, Bld 119 (*)    Calcium 8.7 (*)    Albumin 2.9 (*)    AST 72 (*)    ALT 52 (*)    All other components within normal limits  CULTURE, BLOOD (ROUTINE X 2)  CULTURE, BLOOD (ROUTINE X 2)  SARS CORONAVIRUS 2 (TAT 6-24 HRS)  CBC WITH DIFFERENTIAL/PLATELET  LACTIC ACID, PLASMA  LACTIC ACID, PLASMA  D-DIMER, QUANTITATIVE (NOT AT [facility]  PROCALCITONIN  LACTATE DEHYDROGENASE  FERRITIN  TRIGLYCERIDES  FIBRINOGEN  C-REACTIVE PROTEIN  TROPONIN I (HIGH SENSITIVITY)  TROPONIN I (HIGH SENSITIVITY)    EKG EKG Interpretation  Date/Time:  Wednesday September 19 2019 10:14:58 EST Ventricular Rate:  104 PR Interval:    QRS Duration: 86 QT Interval:  341 QTC Calculation: 449 R Axis:   23 Text Interpretation: Sinus tachycardia Probable left atrial enlargement Minimal ST depression, inferior leads No previous ECGs available Confirmed by Fredia Sorrow [id number] on 09/19/2019 10:22:04 AM   Radiology DG Chest Port 1 View  Result Date: 09/19/2019 CLINICAL DATA:  Dry cough, fever EXAM: PORTABLE CHEST 1 VIEW COMPARISON:  08/31/2016 FINDINGS: Bilateral patchy interstitial and alveolar airspace opacities. No pleural effusion or pneumothorax. Stable cardiomediastinal silhouette. IMPRESSION:  1. Bilateral patchy interstitial and alveolar airspace opacities concerning for multilobar pneumonia. Electronically Signed   By: Kathreen Devoid   On: 09/19/2019 11:04    Procedures Procedures (including critical care time)  CRITICAL CARE Performed by: Recardo Evangelist   Total critical care time: 30 minutes  Critical care time was exclusive of separately billable procedures and treating other patients.  Critical care was necessary to treat or prevent imminent or life-threatening deterioration.  Critical care was time spent personally by me on the following activities: development of treatment plan with patient  and/or surrogate as well as nursing, discussions with consultants, evaluation of patient's response to treatment, examination of patient, obtaining history from patient or surrogate, ordering and performing treatments and interventions, ordering and review of laboratory studies, ordering and review of radiographic studies, pulse oximetry and re-evaluation of patient's condition.   Medications Ordered in ED Medications  albuterol (VENTOLIN HFA) 108 (90 Base) MCG/ACT inhaler 4 puff (4 puffs Inhalation Given 09/19/19 1038)    ED Course  I have reviewed the triage vital signs and the nursing notes.  Pertinent labs & imaging results that were available during my care of the patient were reviewed by me and considered in my medical decision making (see chart for details).  54 year old female presents with fever, SOB/CP, cough for 4 days. She was hypoxic at UC to 86%. Here sats are ~94% on RA. She is mildly tachycardic. EKG shows sinus tachycardia. Lungs are CTA although she is mildly tachypenic. Abdomen is soft and non tender. There is no lower extremity edema. Will obtain labs, CXR.  CXR shows multifocal pneumonia. CBC is normal. CMP is remarkable for mild hypokalemia (3.4) and mildly elevated LFTs. First and 2nd trop is 9 and 11. I think her CP is likely MSK due to coughing.    Ambulatory sats were not attempted since sats were 90% on RA at rest. She was placed on 2L via [place] and this improved to 100%.   Discussed with Dr. Frances Furbish with IM team who will admit for acute respiratory failure with hypoxia likely due to suspected COVID pneumonia  Tianne Plott was evaluated in Emergency Department on 09/19/2019 for the symptoms described in the history of present illness. She was evaluated in the context of the global COVID-19 pandemic, which necessitated consideration that the patient might be at risk for infection with the SARS-CoV-2 virus that causes COVID-19. Institutional protocols and algorithms that pertain to the evaluation of patients at risk for COVID-19 are in a state of rapid change based on information released by regulatory bodies including the CDC and federal and state organizations. These policies and algorithms were followed during the patient's care in the ED.  MDM Rules/Calculators/A&P                       Final Clinical Impression(s) / ED Diagnoses Final diagnoses:  Suspected COVID-19 virus infection  Acute respiratory failure with hypoxia [facility])  Multifocal pneumonia    Rx / DC Orders ED Discharge Orders    None       Bethel Born, PA-C 09/19/19 1545    Vanetta Mulders, MD 09/23/19 [phone]

## 2019-09-19 NOTE — ED Notes (Signed)
MD paged due to pt's COVID+ result. Admission order changed

## 2019-09-19 NOTE — ED Notes (Signed)
Attempted report x1. 

## 2019-09-19 NOTE — ED Notes (Signed)
Went in to Ambulated Pt on Pulse Ox but Pt was 92% on Room Air just laying in the bed. While talking to Pt it dropped to 90%. Communicated all of this with PA Tresa Endo. Tresa Endo agreed it was ok not to walk her yet and to put her on 2L Nasal O2.

## 2019-09-19 NOTE — ED Triage Notes (Signed)
Pt arrives via [facility] from urgent care for chest pain, sob, cough and fever x3 days. Pt had recent covid exposure last week, had negative covid test then. Went to urgent care today where she had another covid test that is still pending. Ems reports fever 100.8, pt received 4mg  zofran, 324mg  asa and 700cc NS pta. Pt had initial O2 sat of 91% on room with with ems, cbg 100. Pt a/ox4, resp e/u, nad.

## 2019-09-20 LAB — ABO/RH: ABO/RH(D): O POS

## 2019-09-20 LAB — HIV ANTIBODY (ROUTINE TESTING W REFLEX): HIV Screen 4th Generation wRfx: NONREACTIVE

## 2019-09-20 NOTE — Progress Notes (Signed)
   Subjective: Pt seen at the bedside this morning. States she is feeling mildly improved from yesterday, but still having trouble with a cough when changing positions. Felt short of breath getting up to the bathroom. Was able to eat some breakfast without difficulty.   Objective:  Vital signs in last 24 hours: Vitals:   09/19/19 2043 09/20/19 0020 09/20/19 0110 09/20/19 0344  BP:   123/81   Pulse:  90 90   Resp:   (!) 24   Temp: (!) 101.2 F (38.4 C) 99 F (37.2 C)  97.7 F (36.5 C)  TempSrc: Oral Oral  Oral  SpO2:  100% 99%   Weight: 93.7 kg     Height: 5\' 6"  (1.676 m)      Physical Exam Vitals and nursing note reviewed.  Constitutional:      General: She is not in acute distress.    Appearance: She is not ill-appearing.     Comments: Pt sitting up at the bedside finishing breakfast. Coughing much less than examination yesterday.  Pulmonary:     Effort: Pulmonary effort is normal. No tachypnea.     Comments: On room air. O2 saturation 98-100%. Diffuse rhonchi without wheezing on ascultation, unchanged from prior. Neurological:     Mental Status: She is alert.    Assessment/Plan:  Active Problems:   Multifocal pneumonia   COVID-19  Ms. Juneau is a 54 year old F with significant PMH of asthma, who presents for a 3 day history of shortness of breath, cough, and pleuritic chest pain, bilateral patchy opacities on CXR, and elevated inflammatory markers consistent with COVID-19 infection.  COVID-19 infection Pt with known COVID exposures at home. CXR showing bilateral patchy interstitial and alveolar airspace opacities concerning for multilobar pneumonia. Pt with a history of asthma and no other chronic medical conditions.  - COVID PCR positive on 3/3 - pt is on room air currently, will need to ambulate with pulse ox to she how she does - day 2 of 10 of dexamethasone - day 2 of 5 of remdesivir  - if pt's O2 requirement increases, could consider addition of baricitinib  -  will check labs tomorrow with CMP to monitor LFTs on remdesivir - supportive care with tussionex, robitussin, tylenol, and albuterol - continuous pulse ox  Diet - regular Fluids - none DVT ppx - enoxaparin 40mg  subQ daily CODE STATUS - FULL CODE  Prior to Admission Living Arrangement: home Anticipated Discharge Location: home Barriers to Discharge: clinical improvement, possibility for supplemental home O2 Dispo: Anticipated discharge in approximately 2-3 day(s).   44, MD 09/20/2019, 7:07 AM Pager: [phone]

## 2019-09-21 LAB — COMPREHENSIVE METABOLIC PANEL
ALT: 57 U/L — ABNORMAL HIGH (ref 0–44)
AST: 47 U/L — ABNORMAL HIGH (ref 15–41)
Albumin: 2.8 g/dL — ABNORMAL LOW (ref 3.5–5.0)
Alkaline Phosphatase: 84 U/L (ref 38–126)
Anion gap: 12 (ref 5–15)
BUN: 15 mg/dL (ref 6–20)
CO2: 24 mmol/L (ref 22–32)
Calcium: 8.9 mg/dL (ref 8.9–10.3)
Chloride: 107 mmol/L (ref 98–111)
Creatinine, Ser: 0.71 mg/dL (ref 0.44–1.00)
GFR calc Af Amer: >60 mL/min (ref >60)
GFR calc non Af Amer: >60 mL/min (ref >60)
Glucose, Bld: 126 mg/dL — ABNORMAL HIGH (ref 70–99)
Potassium: 4 mmol/L (ref 3.5–5.1)
Sodium: 143 mmol/L (ref 135–145)
Total Bilirubin: 0.6 mg/dL (ref 0.3–1.2)
Total Protein: 7.5 g/dL (ref 6.5–8.1)

## 2019-09-21 LAB — FERRITIN: Ferritin: 598 ng/mL — ABNORMAL HIGH (ref 11–307)

## 2019-09-21 LAB — C-REACTIVE PROTEIN: CRP: 11.6 mg/dL — ABNORMAL HIGH (ref <1.0)

## 2019-09-21 LAB — LACTATE DEHYDROGENASE: LDH: 263 U/L — ABNORMAL HIGH (ref 98–192)

## 2019-09-21 LAB — D-DIMER, QUANTITATIVE: D-Dimer, Quant: 0.41 ug/mL-FEU (ref 0.00–0.50)

## 2019-09-21 LAB — TRIGLYCERIDES: Triglycerides: 45 mg/dL (ref <150)

## 2019-09-21 LAB — FIBRINOGEN: Fibrinogen: 681 mg/dL — ABNORMAL HIGH (ref 210–475)

## 2019-09-21 MED ORDER — ALBUTEROL SULFATE HFA 108 (90 BASE) MCG/ACT IN AERS: 2.0000 | INHALATION_SPRAY | Freq: Four times a day (QID) | RESPIRATORY_TRACT | 0 refills | Status: DC | PRN

## 2019-09-21 MED ORDER — DEXAMETHASONE 6 MG PO TABS: 6.0000 mg | ORAL_TABLET | Freq: Every day | ORAL | 0 refills | Status: DC

## 2019-09-21 NOTE — Plan of Care (Signed)
  Problem: Education: Goal: Knowledge of General Education information will improve Description: Including pain rating scale, medication(s)/side effects and non-pharmacologic comfort measures Outcome: Completed/Met

## 2019-09-21 NOTE — Progress Notes (Signed)
   Subjective: Pt seen at the bedside this morning. States she is feeling improved. Was able to get up out of bed and to the cabinet with only minimal shortness of breath. Mild persistent cough. On room air. Looking forward to discharge home later today.  Objective:  Vital signs in last 24 hours: Vitals:   09/20/19 1117 09/20/19 1654 09/20/19 2000 09/21/19 0425  BP:   (!) 155/93 140/87  Pulse:   84 80  Resp:  18  18  Temp: 98.4 F (36.9 C) 98.7 F (37.1 C) 98.4 F (36.9 C) 98.7 F (37.1 C)  TempSrc: Oral Oral Oral Oral  SpO2:   91% 94%  Weight:      Height:       Physical Exam Vitals and nursing note reviewed.  Constitutional:      General: She is not in acute distress.    Appearance: She is not ill-appearing.     Comments: Pt standing up and walking in the room. Appears comfortable and in no acute distress.  Pulmonary:     Effort: Pulmonary effort is normal.     Comments: On room air. Improved air movement. Scattered rhonchi. Neurological:     General: No focal deficit present.     Mental Status: She is alert and oriented to person, place, and time. Mental status is at baseline.  Psychiatric:        Mood and Affect: Mood normal.    Assessment/Plan:  Active Problems:   Multifocal pneumonia   COVID-19  Ms. Fleeger is a 54 year old F with significant PMH of asthma, who presents for a 3 day history of shortness of breath, cough, and pleuritic chest pain, bilateral patchy opacities on CXR, and elevated inflammatory markers consistent with COVID-19 infection.  COVID-19 infection Pt with known COVID exposures at home. CXR showing bilateral patchy interstitial and alveolar airspace opacities concerning for multilobar pneumonia. Pt's symptoms all improving today. On room air.  - COVID PCR positive on 3/3, symptoms began 2/23 - instruct pt to isolate 21 days from symptom onset - on room air, able to ambulate without any drops in O2 sats - will send 8 more days of oral  dexamethsone to the pharm for pt to complete 10 day course - pt received 3 doses of remdesivir while in the hospital, declined outpatient infusion center for final 2 doses - LFTs and COVID labs improving this morning - supportive care with tussionex, robitussin, tylenol, and albuterol - continuous pulse ox  Diet - regular Fluids - none DVT ppx - enoxaparin 40mg  subQ daily CODE STATUS - FULL CODE  Dispo: Anticipated discharge home today.   , MD 09/21/2019, 6:40 AM Pager: [phone]

## 2019-09-21 NOTE — Discharge Summary (Signed)
Name: Michelle Whitehead MRN: [id number] DOB: 1966-02-29 54 y.o. PCP: Patient, No Pcp Per  Date of Admission: 09/19/2019 10:11 AM Date of Discharge: 09/21/2019 Attending Physician: Lucious Groves, DO  Discharge Diagnosis: Active Problems:   Multifocal pneumonia   COVID-19  Discharge Medications: Allergies as of 09/21/2019   No Known Allergies     Medication List    TAKE these medications   albuterol 108 (90 Base) MCG/ACT inhaler Commonly known as: VENTOLIN HFA Inhale 2 puffs into the lungs every 6 (six) hours as needed for wheezing or shortness of breath.   dexamethasone 6 MG tablet Commonly known as: DECADRON Take 1 tablet (6 mg total) by mouth daily.   HYDROcodone-acetaminophen 5-325 MG tablet Commonly known as: Norco Take 1 tablet by mouth every 6 (six) hours as needed for moderate pain.   ibuprofen 200 MG tablet Commonly known as: ADVIL Take 400-600 mg by mouth every 6 (six) hours as needed for moderate pain.       Disposition and follow-up:   Michelle Whitehead was discharged from [facility] in Good condition.  At the hospital follow up visit please address:  1.  COVID-19 Infection - pt tested positive on 3/3 - treated with 3 days of remdesivir inpatient, and given 10 day course of dexamethasone to be completed outpatient - needs to isolate for 21 after symptom onset - hospital follow-up with PCP via telehealth appointment   2.  Labs / imaging needed at time of follow-up: NONE  3.  Pending labs/ test needing follow-up: NONE  Follow-up Appointments: Pt to schedule telehealth appointment with her PCP at [facility].  Hospital Course by problem list: 1. Michelle Whitehead is a 54 year old F with significant PMH of asthma, who presented for a 3 day history of shortness of breath, cough, and pleuritic chest pain. She had multiple COVID positive contacts at home. Pt was sent from the urgent care where she initially sought care, because she was febrile to 100.9,  tachycardic, and hypoxic with pulse ox reading 86%. EMS administered 4mg  ondansetron, 324 ASA, and 700cc NS. In the ED, pt's O2 saturation was 91% at rest on room air, so she was placed on 2L [place].  CXR showed bilateral patchy opacities. Antigen testing on 3/3 was negative, but pt's hospital PCR test resulted positive for COVID-19 later that day.   Pt was treated with dexamethasone and remdesivir. On hospital day 1, pt was on room air though endorsed shortness of breath upon ambulating in the room. On hospital day 2, pt's symptoms continued to improve. Her inflammatory markers (d-dimer, CRP, ferritin, and fibrinogen) were initially elevated, but downtrended on day of discharge. She ambulated on room air and maintained an oxygen saturation >94%. Pt completed 3 of 5 total days of remdesivir, and elected not to complete the final 2 doses outpatient. She was given 8 days of PO dexamethasone to complete her 10 day course outpatient given her underlying history of asthma.   Pt was instructed to isolate 21 days from the onset of her symptoms which she stated was 09/11/2019. She is to follow-up with her PCP via a telehealth appointment for a hospital follow-up.    Discharge Vitals:   BP (!) 150/104   Pulse 100   Temp 98.7 F (37.1 C) (Oral)   Resp 18   Ht 5\' 6"  (1.676 m)   Wt 93.7 kg   SpO2 95%   BMI 33.34 kg/m   Pertinent Labs, Studies, and Procedures:  CBC  Latest Ref Rng & Units 09/19/2019 08/31/2016 07/07/2014  WBC 4.0 - 10.5 K/uL 7.4 7.6 6.4  Hemoglobin 12.0 - 15.0 g/dL 61.4 43.1 54.0  Hematocrit 36.0 - 46.0 % 39.4 35.7(A) 39.5  Platelets 150 - 400 K/uL 311 - -   CMP Latest Ref Rng & Units 09/21/2019 09/19/2019 08/31/2016  Glucose 70 - 99 mg/dL 086(P) 619(J) 84  BUN 6 - 20 mg/dL 15 11 13   Creatinine 0.44 - 1.00 mg/dL 0.93 2.67  Sodium 135 - 145 mmol/L 143 139 145(H)  Potassium 3.5 - 5.1 mmol/L 4.0 3.4(L) 4.1  Chloride 98 - 111 mmol/L 107 105 104  CO2 22 - 32 mmol/L 24 23 22   Calcium 8.9 -  10.3 mg/dL 8.9 1.24) 9.1  Total Protein 6.5 - 8.1 g/dL 7.5 7.3 -  Total Bilirubin 0.3 - 1.2 mg/dL 0.6 0.7 -  Alkaline Phos 38 - 126 U/L 84 83 -  AST 15 - 41 U/L 47(H) 72(H) -  ALT 0 - 44 U/L 57(H) 52(H) -      Component Value Date/Time   CRP 11.6 (H) 09/21/2019 0720   CRP 16.5 (H) 09/19/2019 1401   Lab Results  Component Value Date   DDIMER 0.41 09/21/2019      Component Value Date/Time   FERRITIN 598 (H) 09/21/2019 0720   Recent Results (from the past 240 hour(s))  SARS CORONAVIRUS 2 (TAT 6-24 HRS) Nasopharyngeal Nasopharyngeal Swab     Status: Abnormal   Collection Time: 09/19/19  2:03 PM   Specimen: Nasopharyngeal Swab  Result Value Ref Range Status   SARS Coronavirus 2 POSITIVE (A) NEGATIVE Final    Comment: RESULT CALLED TO, READ BACK BY AND VERIFIED WITH: S GRINDSTAFF,RN 1918 09/19/2019 D BRADLEY (NOTE) SARS-CoV-2 target nucleic acids are DETECTED. The SARS-CoV-2 RNA is generally detectable in upper and lower respiratory specimens during the acute phase of infection. Positive results are indicative of the presence of SARS-CoV-2 RNA. Clinical correlation with patient history and other diagnostic information is  necessary to determine patient infection status. Positive results do not rule out bacterial infection or co-infection with other viruses.  The expected result is Negative. Fact Sheet for Patients: 11/19/19 Fact Sheet for Healthcare Providers: 11/19/2019 This test is not yet approved or cleared by the [place] FDA and  has been authorized for detection and/or diagnosis of SARS-CoV-2 by FDA under an Emergency Use Authorization (EUA). This EUA will remain  in effect (meaning this test can be used) for  the duration of the COVID-19 declaration under Section 564(b)(1) of the Act, 21 U.S.C. section 360bbb-3(b)(1), unless the authorization is terminated or revoked sooner. Performed at [facility], [address], [place], [address] [place]   Blood Culture (routine x 2)     Status: None (Preliminary result)   Collection Time: 09/19/19  3:01 PM   Specimen: BLOOD  Result Value Ref Range Status   Specimen Description BLOOD RIGHT ANTECUBITAL  Final   Special Requests   Final    BOTTLES DRAWN AEROBIC ONLY Blood Culture results may not be optimal due to an inadequate volume of blood received in culture bottles   Culture   Final    NO GROWTH 4 DAYS Performed at [facility], [address] [place], [address] [place]    Report Status PENDING  Incomplete  Blood Culture (routine x 2)     Status: None (Preliminary result)   Collection Time: 09/19/19  3:01 PM   Specimen: BLOOD RIGHT  HAND  Result Value Ref Range Status   Specimen Description BLOOD RIGHT HAND  Final   Special Requests   Final    BOTTLES DRAWN AEROBIC ONLY Blood Culture results may not be optimal due to an inadequate volume of blood received in culture bottles   Culture   Final    NO GROWTH 4 DAYS Performed at [facility], [address] [place], [place] [address]    Report Status PENDING  Incomplete   DG Chest Port 1 View CLINICAL DATA:  Dry cough, fever  EXAM: PORTABLE CHEST 1 VIEW  COMPARISON:  08/31/2016  FINDINGS: Bilateral patchy interstitial and alveolar airspace opacities. No pleural effusion or pneumothorax. Stable cardiomediastinal silhouette.  IMPRESSION: 1. Bilateral patchy interstitial and alveolar airspace opacities concerning for multilobar pneumonia.  Electronically Signed   By: Elige Ko   On: 09/19/2019 11:04    Discharge Instructions: Discharge Instructions    Call MD for:   Complete by: As directed    Worsening shortness of breath, cough, chest pain, or persistent fevers.   Call MD for:  difficulty breathing, headache or visual disturbances   Complete by: As directed    Call MD for:  extreme fatigue   Complete by: As directed    Call MD for:  persistant  dizziness or light-headedness   Complete by: As directed    Call MD for:  persistant nausea and vomiting   Complete by: As directed    Call MD for:  temperature >100.4   Complete by: As directed    Diet - low sodium heart healthy   Complete by: As directed    Discharge instructions   Complete by: As directed    Mrs. Whitehead,  You were seen in the hospital for a COVID-19 infection. You were treated with some extra oxygen, steroids, and remdesivir and your symptoms improved. A prescription for 8 more days of steroids has been sent into your [facility]. Please quarantine 21 days from symptom onset (09/11/2019). You may schedule a hospital follow-up with your primary doctor for a telehealth appointment to ensure your symptoms are resolving.  Thank you for letting us be a part of your care!   Increase activity slowly   Complete by: As directed       Signed: Thom Chimes, MD 09/21/2019, 12:48 PM   Pager: [phone]

## 2019-09-24 LAB — CULTURE, BLOOD (ROUTINE X 2)
Culture: NO GROWTH
Culture: NO GROWTH

## 2019-10-11 ENCOUNTER — Other Ambulatory Visit: Payer: Self-pay | Admitting: Internal Medicine

## 2020-05-12 ENCOUNTER — Ambulatory Visit (INDEPENDENT_AMBULATORY_CARE_PROVIDER_SITE_OTHER): Payer: BC Managed Care – PPO

## 2020-05-12 ENCOUNTER — Encounter: Payer: Self-pay | Admitting: Emergency Medicine

## 2020-05-12 ENCOUNTER — Ambulatory Visit
Admission: EM | Admit: 2020-05-12 | Discharge: 2020-05-12 | Disposition: A | Discharge status: Home / Self Care | Payer: BC Managed Care – PPO | Attending: Emergency Medicine | Admitting: Emergency Medicine

## 2020-05-12 ENCOUNTER — Other Ambulatory Visit: Payer: Self-pay

## 2020-05-12 DIAGNOSIS — Z20822 Contact with and (suspected) exposure to covid-19: Secondary | ICD-10-CM | POA: Insufficient documentation

## 2020-05-12 DIAGNOSIS — R0602 Shortness of breath: Secondary | ICD-10-CM | POA: Diagnosis not present

## 2020-05-12 DIAGNOSIS — R0981 Nasal congestion: Secondary | ICD-10-CM | POA: Insufficient documentation

## 2020-05-12 DIAGNOSIS — J069 Acute upper respiratory infection, unspecified: Secondary | ICD-10-CM | POA: Diagnosis not present

## 2020-05-12 DIAGNOSIS — R059 Cough, unspecified: Secondary | ICD-10-CM | POA: Insufficient documentation

## 2020-05-12 DIAGNOSIS — Z8616 Personal history of COVID-19: Secondary | ICD-10-CM | POA: Diagnosis not present

## 2020-05-12 LAB — GROUP A STREP BY PCR: Group A Strep by PCR: NOT DETECTED

## 2020-05-12 MED ORDER — PROMETHAZINE-DM 6.25-15 MG/5ML PO SYRP: 5.0000 mL | ORAL_SOLUTION | Freq: Four times a day (QID) | ORAL | 0 refills | Status: DC | PRN

## 2020-05-12 MED ORDER — ALBUTEROL SULFATE HFA 108 (90 BASE) MCG/ACT IN AERS: 1.0000 | INHALATION_SPRAY | Freq: Four times a day (QID) | RESPIRATORY_TRACT | 0 refills | Status: DC | PRN

## 2020-05-12 MED ORDER — BENZONATATE 100 MG PO CAPS: 200.0000 mg | ORAL_CAPSULE | Freq: Three times a day (TID) | ORAL | 0 refills | Status: DC

## 2020-05-12 NOTE — ED Triage Notes (Signed)
Patient in today c/o 3-4 day history of nasal congestion, sore throat, cough, ear fullness and headache. Patient has taken OTC Dayquil/Nyquil and Tylenol cold. Patient denies fever. Patient has completed the covid vaccines.

## 2020-05-12 NOTE — Discharge Instructions (Addendum)
TheIsolate until the results of your Covid test are back.  The test is positive you will need to quarantine for 10 days from the start of your symptoms.  After 10 days you can break quarantine if your symptoms have improved and you have not had a fever for 10 days.  Use the albuterol inhaler as needed for wheezing, 1 to 2 puffs every 4-6 hours.  Use the Tessalon Perles during the day for cough and the Promethazine DM for nighttime.  If you develop worsening shortness of breath, especially at rest, and cannot catch her breath go to the ER for evaluation.

## 2020-05-12 NOTE — ED Provider Notes (Signed)
[facility]    CSN: [id number] Arrival date & time: 05/12/20  1402      History   Chief Complaint Chief Complaint  Patient presents with  . Nasal Congestion  . Sore Throat  . Cough  . Ear Fullness  . Headache    HPI Michelle Whitehead is a 54 y.o. female.   54 year old family here for evaluation of nasal congestion, sore throat, cough, ear fullness, headache x3 to 4 days.  Patient also complaining of pain in her chest from her coughing.  Patient denies fever.  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  Patient has been fully vaccinated against Covid with the Pfizer vaccine.  She has not had a booster.     Past Medical History:  Diagnosis Date  . Allergy   . Asthma     Patient Active Problem List   Diagnosis Date Noted  . Multifocal pneumonia 09/19/2019  . COVID-19 09/19/2019    Past Surgical History:  Procedure Laterality Date  . NO PAST SURGERIES      OB History   No obstetric history on file.      Home Medications    Prior to Admission medications   Medication Sig Start Date End Date Taking? Authorizing Provider  albuterol (VENTOLIN HFA) 108 (90 Base) MCG/ACT inhaler Inhale 1-2 puffs into the lungs every 6 (six) hours as needed for wheezing or shortness of breath. 05/12/20   Becky Augusta, NP  benzonatate (TESSALON) 100 MG capsule Take 2 capsules (200 mg total) by mouth every 8 (eight) hours. 05/12/20   Becky Augusta, NP  promethazine-dextromethorphan (PROMETHAZINE-DM) 6.25-15 MG/5ML syrup Take 5 mLs by mouth 4 (four) times daily as needed. 05/12/20   Becky Augusta, NP    Family History Family History  Problem Relation Age of Onset  . Hypertension Mother   . Hypertension Father   . Heart disease Father   . Diabetes Father   . Heart disease Maternal Grandfather   . Hypertension Paternal Grandmother   . Diabetes Paternal Grandmother     Social History Social History   Tobacco Use  . Smoking status:  Never Smoker  . Smokeless tobacco: Never Used  Vaping Use  . Vaping Use: Never used  Substance Use Topics  . Alcohol use: Yes    Alcohol/week: 3.0 standard drinks    Types: 3 Glasses of wine per week  . Drug use: No     Allergies   Patient has no known allergies.   Review of Systems Review of Systems  Constitutional: Negative for activity change, appetite change and fever.  HENT: Positive for congestion, ear pain, postnasal drip, rhinorrhea and sore throat.   Respiratory: Positive for cough, shortness of breath and wheezing.   Cardiovascular: Positive for chest pain.       Patient reports chest pain from coughing.  Gastrointestinal: Negative for diarrhea, nausea and vomiting.  Genitourinary: Negative for dysuria and frequency.  Musculoskeletal: Positive for arthralgias and myalgias.  Skin: Negative for rash.  Neurological: Negative for syncope and headaches.  Hematological: Negative.   Psychiatric/Behavioral: Negative.      Physical Exam Triage Vital Signs ED Triage Vitals  Enc Vitals Group     BP 05/12/20 1436 128/84     Pulse Rate 05/12/20 1436 83     Resp 05/12/20 1436 18     Temp 05/12/20 1436 98.3 F (36.8 C)     Temp Source 05/12/20 1436 Oral  SpO2 05/12/20 1436 100 %     Weight 05/12/20 1436 220 lb (99.8 kg)     Height 05/12/20 1436 5\' 6"  (1.676 m)     Head Circumference --      Peak Flow --      Pain Score 05/12/20 1435 6     Pain Loc --      Pain Edu? --      Excl. in [place]? --    No data found.  Updated Vital Signs BP 128/84 (BP Location: Left Arm)   Pulse 83   Temp 98.3 F (36.8 C) (Oral)   Resp 18   Ht 5\' 6"  (1.676 m)   Wt 220 lb (99.8 kg)   SpO2 100%   BMI 35.51 kg/m   Visual Acuity Right Eye Distance:   Left Eye Distance:   Bilateral Distance:    Right Eye Near:   Left Eye Near:    Bilateral Near:     Physical Exam Vitals reviewed.  Constitutional:      General: She is not in acute distress.    Appearance: She is  ill-appearing.  HENT:     Head: Normocephalic and atraumatic.     Right Ear: Tympanic membrane and ear canal normal. No middle ear effusion. Tympanic membrane is not erythematous.     Left Ear: Tympanic membrane and ear canal normal.  No middle ear effusion. Tympanic membrane is not erythematous.     Nose: Congestion and rhinorrhea present.     Comments: Nasal mucosa is erythematous and edematous with clear nasal discharge.    Mouth/Throat:     Mouth: Mucous membranes are moist.     Pharynx: Pharyngeal swelling and posterior oropharyngeal erythema present. No oropharyngeal exudate.     Tonsils: No tonsillar exudate or tonsillar abscesses.     Comments: Posterior oropharynx has erythema and clear postnasal drip.  Tonsillar pillars are erythematous with mild edema no exudate. Eyes:     Conjunctiva/sclera: Conjunctivae normal.     Pupils: Pupils are equal, round, and reactive to light.  Neck:     Comments: Patient has shotty anterior lymphadenopathy. Cardiovascular:     Rate and Rhythm: Normal rate and regular rhythm.     Heart sounds: Normal heart sounds. No murmur heard.  No gallop.   Pulmonary:     Effort: Pulmonary effort is normal.     Breath sounds: Wheezing and rales present.     Comments: Patient has diffuse wheezing, fine rales in the left base, decreased lung sounds in the right base. Musculoskeletal:     Cervical back: Normal range of motion and neck supple.  Lymphadenopathy:     Cervical: Cervical adenopathy present.  Skin:    General: Skin is warm and dry.     Capillary Refill: Capillary refill takes less than 2 seconds.     Findings: No erythema or rash.  Neurological:     General: No focal deficit present.     Mental Status: She is alert and oriented to person, place, and time.  Psychiatric:        Mood and Affect: Mood normal.        Behavior: Behavior normal.      UC Treatments / Results  Labs (all labs ordered are listed, but only abnormal results are  displayed) Labs Reviewed  GROUP A STREP BY PCR  SARS CORONAVIRUS 2 (TAT 6-24 HRS)    EKG   Radiology DG Chest 2 View  Result Date: 05/12/2020 CLINICAL DATA:  Cough, shortness of breath EXAM: CHEST - 2 VIEW COMPARISON:  09/19/2019 FINDINGS: The heart size and mediastinal contours are within normal limits. Both lungs are clear. The visualized skeletal structures are unremarkable. IMPRESSION: No active cardiopulmonary disease. Electronically Signed   By: Duanne Guess D.O.   On: 05/12/2020 15:44    Procedures Procedures (including critical care time)  Medications Ordered in UC Medications - No data to display  Initial Impression / Assessment and Plan / UC Course  I have reviewed the triage vital signs and the nursing notes.  Pertinent labs & imaging results that were available during my care of the patient were reviewed by me and considered in my medical decision making (see chart for details).   Patient is here for evaluation of 3 to 4 days of cold symptoms.  Patient has had her full Covid vaccine series.  She states that she has been around multiple people with similar symptoms as she works at a [employer] and everyone has "the crud".  Nasal mucosa is erythematous and edematous consistent with a viral URI.  There is clear nasal discharge.  Oropharynx has some erythema and clear postnasal drip.  No exudate.  Lung sounds are most concerning with the diffuse wheezing, coarse Rales in the left base and decreased lung sounds in the right base.  Patient has not had a fever.  Will obtain chest x-ray.  We will also check COVID and strep PCR.  Strep PCR is negative.  Chest x-ray negative per radiology read.  Will DC home with diagnosis of URI, give benzonatate, Promethazine DM, and albuterol for symptoms management have patient isolate until Covid test is back.  If Covid test is positive will refer for monoclonal antibody infusion.     Final Clinical Impressions(s) / UC Diagnoses    Final diagnoses:  Viral URI with cough     Discharge Instructions     TheIsolate until the results of your Covid test are back.  The test is positive you will need to quarantine for 10 days from the start of your symptoms.  After 10 days you can break quarantine if your symptoms have improved and you have not had a fever for 10 days.  Use the albuterol inhaler as needed for wheezing, 1 to 2 puffs every 4-6 hours.  Use the Tessalon Perles during the day for cough and the Promethazine DM for nighttime.  If you develop worsening shortness of breath, especially at rest, and cannot catch her breath go to the ER for evaluation.    ED Prescriptions    Medication Sig Dispense Auth. Provider   albuterol (VENTOLIN HFA) 108 (90 Base) MCG/ACT inhaler Inhale 1-2 puffs into the lungs every 6 (six) hours as needed for wheezing or shortness of breath. 18 g Becky Augusta, NP   promethazine-dextromethorphan (PROMETHAZINE-DM) 6.25-15 MG/5ML syrup Take 5 mLs by mouth 4 (four) times daily as needed. 118 mL Becky Augusta, NP   benzonatate (TESSALON) 100 MG capsule Take 2 capsules (200 mg total) by mouth every 8 (eight) hours. 21 capsule Becky Augusta, NP     PDMP not reviewed this encounter.   Becky Augusta, NP 05/12/20 [phone]

## 2020-05-13 LAB — SARS CORONAVIRUS 2 (TAT 6-24 HRS): SARS Coronavirus 2: NEGATIVE

## 2021-03-03 ENCOUNTER — Other Ambulatory Visit: Payer: Self-pay | Admitting: Family Medicine

## 2021-03-03 DIAGNOSIS — Z1231 Encounter for screening mammogram for malignant neoplasm of breast: Secondary | ICD-10-CM

## 2021-03-06 ENCOUNTER — Other Ambulatory Visit: Payer: Self-pay

## 2021-03-06 ENCOUNTER — Ambulatory Visit
Admission: RE | Admit: 2021-03-06 | Discharge: 2021-03-06 | Disposition: A | Discharge status: Home / Self Care | Payer: BC Managed Care – PPO | Source: Ambulatory Visit | Attending: Family Medicine | Admitting: Family Medicine

## 2021-03-06 DIAGNOSIS — Z1231 Encounter for screening mammogram for malignant neoplasm of breast: Secondary | ICD-10-CM

## 2021-03-25 ENCOUNTER — Other Ambulatory Visit: Payer: Self-pay

## 2021-03-25 ENCOUNTER — Ambulatory Visit
Admission: EM | Admit: 2021-03-25 | Discharge: 2021-03-25 | Disposition: A | Discharge status: Home / Self Care | Payer: BC Managed Care – PPO | Attending: Family Medicine | Admitting: Family Medicine

## 2021-03-25 DIAGNOSIS — R079 Chest pain, unspecified: Secondary | ICD-10-CM

## 2021-03-25 MED ORDER — MELOXICAM 15 MG PO TABS: 15.0000 mg | ORAL_TABLET | Freq: Every day | ORAL | 0 refills | Status: DC | PRN

## 2021-03-25 NOTE — Discharge Instructions (Addendum)
EKG looks good.  Medication as prescribed.  If you worsen, go directly to the ER.  Follow up with your physician.  Take care  Dr. Adriana Simas

## 2021-03-25 NOTE — ED Triage Notes (Signed)
Pt here with C/O Chest discomfort for 2-3 days. Aches/burns. Is going down left arm.

## 2021-03-27 NOTE — ED Provider Notes (Signed)
[facility]    CSN: [id number] Arrival date & time: 03/25/21  1744      History   Chief Complaint Chest pain  HPI 55 year old female presents with the above complaint.  Patient reports a 2 to 3-day history of chest discomfort.  She states that she also has pain in the left arm.  She states that it aches and burns.  Some shortness of breath.  No reports of diaphoresis.  No nausea vomiting.  Pain 5/10 in severity.  No relieving factors.  She has had this previously according to the EMR.  No other associated symptoms.  No other complaints.  Past Medical History:  Diagnosis Date   Allergy    Asthma     Patient Active Problem List   Diagnosis Date Noted   Multifocal pneumonia 09/19/2019   COVID-19 09/19/2019    Past Surgical History:  Procedure Laterality Date   NO PAST SURGERIES      OB History   No obstetric history on file.      Home Medications    Prior to Admission medications   Medication Sig Start Date End Date Taking? Authorizing Provider  meloxicam (MOBIC) 15 MG tablet Take 1 tablet (15 mg total) by mouth daily as needed for pain. 03/25/21  Yes Johnny Gorter G, DO  albuterol (VENTOLIN HFA) 108 (90 Base) MCG/ACT inhaler Inhale 1-2 puffs into the lungs every 6 (six) hours as needed for wheezing or shortness of breath. 05/12/20   Becky Augusta, NP    Family History Family History  Problem Relation Age of Onset   Hypertension Mother    Hypertension Father    Heart disease Father    Diabetes Father    Heart disease Maternal Grandfather    Hypertension Paternal Grandmother    Diabetes Paternal Grandmother     Social History Social History   Tobacco Use   Smoking status: Never   Smokeless tobacco: Never  Vaping Use   Vaping Use: Never used  Substance Use Topics   Alcohol use: Yes    Alcohol/week: 3.0 standard drinks    Types: 3 Glasses of wine per week   Drug use: No     Allergies   Patient has no known allergies.   Review of  Systems Review of Systems Per HPI  Physical Exam Triage Vital Signs ED Triage Vitals  Enc Vitals Group     BP 03/25/21 1756 (!) 144/82     Pulse Rate 03/25/21 1756 76     Resp 03/25/21 1756 18     Temp 03/25/21 1756 98.6 F (37 C)     Temp Source 03/25/21 1756 Oral     SpO2 03/25/21 1756 98 %     Weight 03/25/21 1755 250 lb (113.4 kg)     Height 03/25/21 1755 5\' 6"  (1.676 m)     Head Circumference --      Peak Flow --      Pain Score 03/25/21 1755 5     Pain Loc --      Pain Edu? --      Excl. in GC? --    Updated Vital Signs BP (!) 144/82 (BP Location: Left Arm)   Pulse 76   Temp 98.6 F (37 C) (Oral)   Resp 18   Ht 5\' 6"  (1.676 m)   Wt 113.4 kg   SpO2 98%   BMI 40.35 kg/m   Visual Acuity Right Eye Distance:   Left Eye Distance:   Bilateral Distance:  Right Eye Near:   Left Eye Near:    Bilateral Near:     Physical Exam Vitals and nursing note reviewed.  Constitutional:      General: She is not in acute distress.    Appearance: Normal appearance. She is not ill-appearing.  HENT:     Head: Normocephalic and atraumatic.  Eyes:     General:        Right eye: No discharge.        Left eye: No discharge.     Conjunctiva/sclera: Conjunctivae normal.  Cardiovascular:     Rate and Rhythm: Normal rate and regular rhythm.     Heart sounds: No murmur heard. Pulmonary:     Effort: Pulmonary effort is normal.     Breath sounds: Normal breath sounds.  Chest:     Chest wall: Tenderness present.  Neurological:     Mental Status: She is alert.  Psychiatric:        Mood and Affect: Mood normal.        Behavior: Behavior normal.     UC Treatments / Results  Labs (all labs ordered are listed, but only abnormal results are displayed) Labs Reviewed - No data to display  EKG Interpretation: Normal sinus rhythm with a rate of 70.  LVH.  No ST or T wave changes.  Radiology No results found.  Procedures Procedures (including critical care  time)  Medications Ordered in UC Medications - No data to display  Initial Impression / Assessment and Plan / UC Course  I have reviewed the triage vital signs and the nursing notes.  Pertinent labs & imaging results that were available during my care of the patient were reviewed by me and considered in my medical decision making (see chart for details).    55 year old female presents with chest pain. EKG with no evidence of ischemia.  I suspect that this is musculoskeletal in nature.  She is tender to palpation on exam.  Meloxicam as directed.  Supportive care.  Final Clinical Impressions(s) / UC Diagnoses   Final diagnoses:  Chest pain, unspecified type     Discharge Instructions      EKG looks good.  Medication as prescribed.  If you worsen, go directly to the ER.  Follow up with your physician.  Take care  Dr. Adriana Simas    ED Prescriptions     Medication Sig Dispense Auth. Provider   meloxicam (MOBIC) 15 MG tablet Take 1 tablet (15 mg total) by mouth daily as needed for pain. 30 tablet Tommie Sams, DO      PDMP not reviewed this encounter.   Tommie Sams, [place] 03/27/21 [phone]

## 2021-11-20 IMAGING — CR DG CHEST 2V
2 series · 2 of 2 positions shown · non-contrast
Comparison: 09/19/2019

CLINICAL DATA: Cough, shortness of breath

EXAM:
CHEST - 2 VIEW

[chest lat]
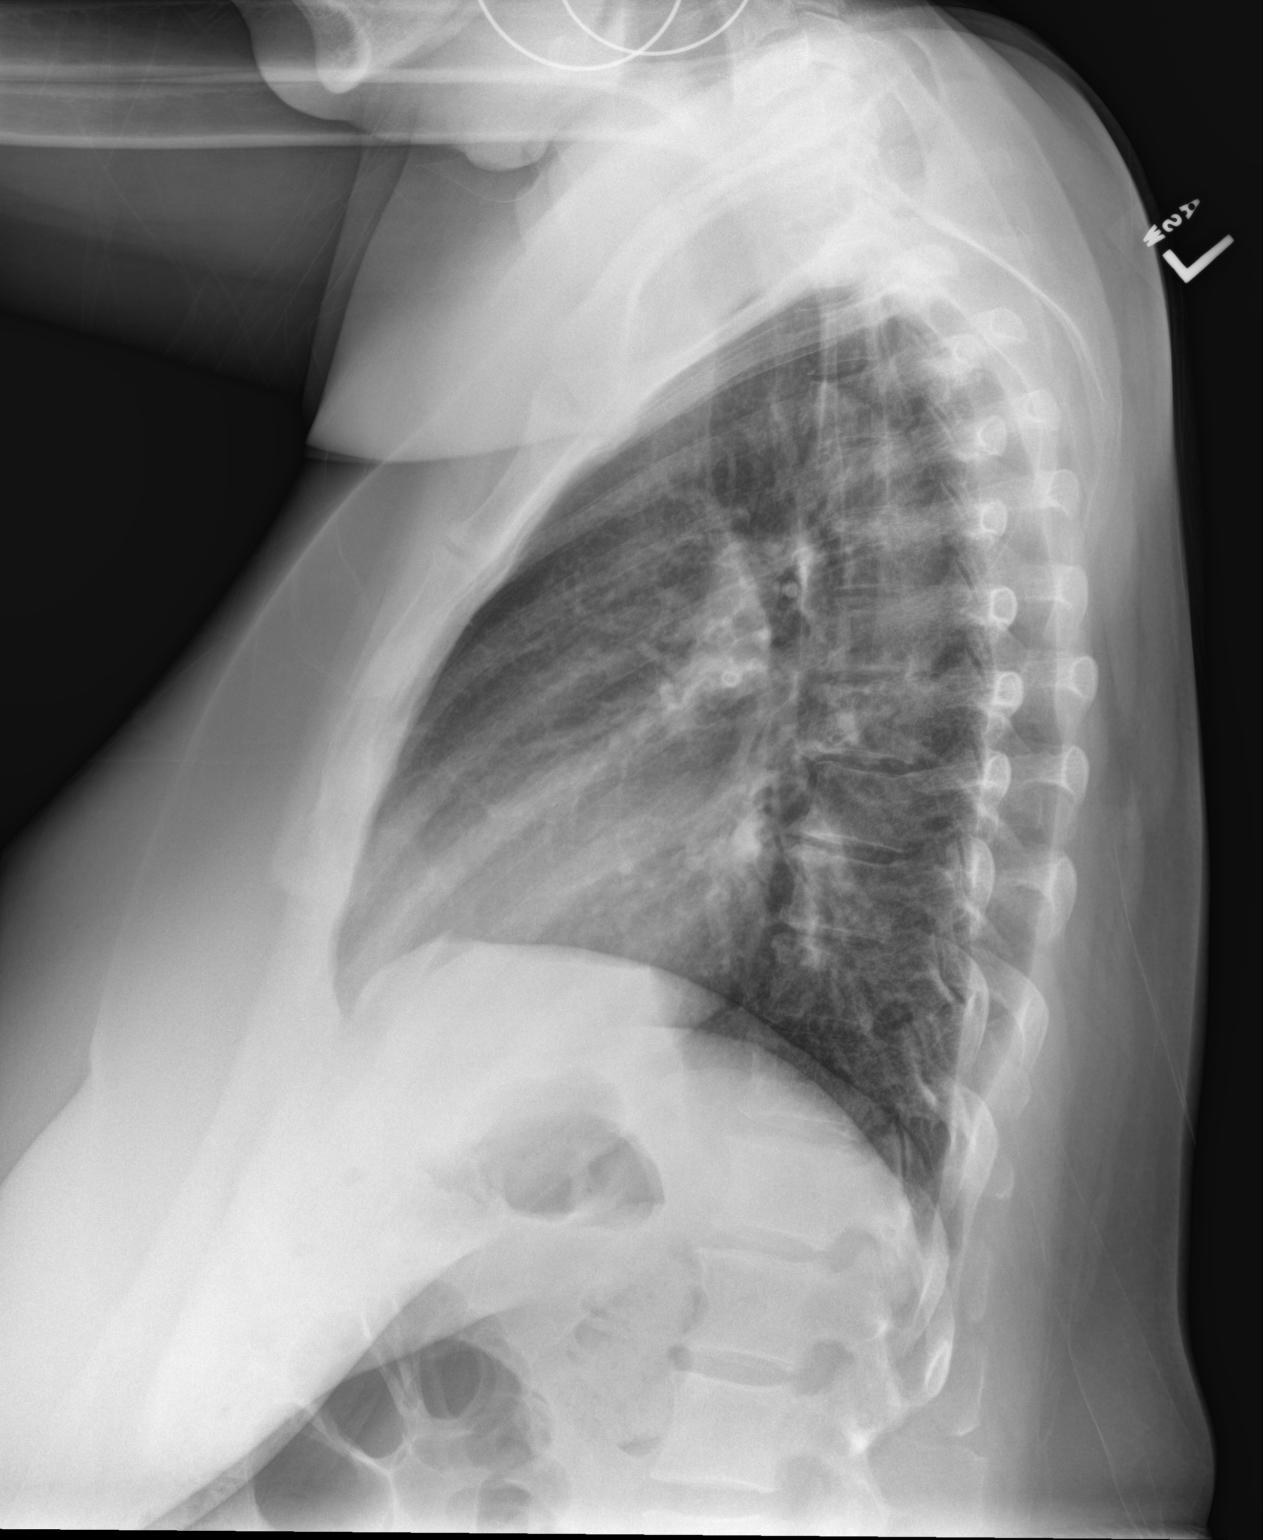

[chest pa]
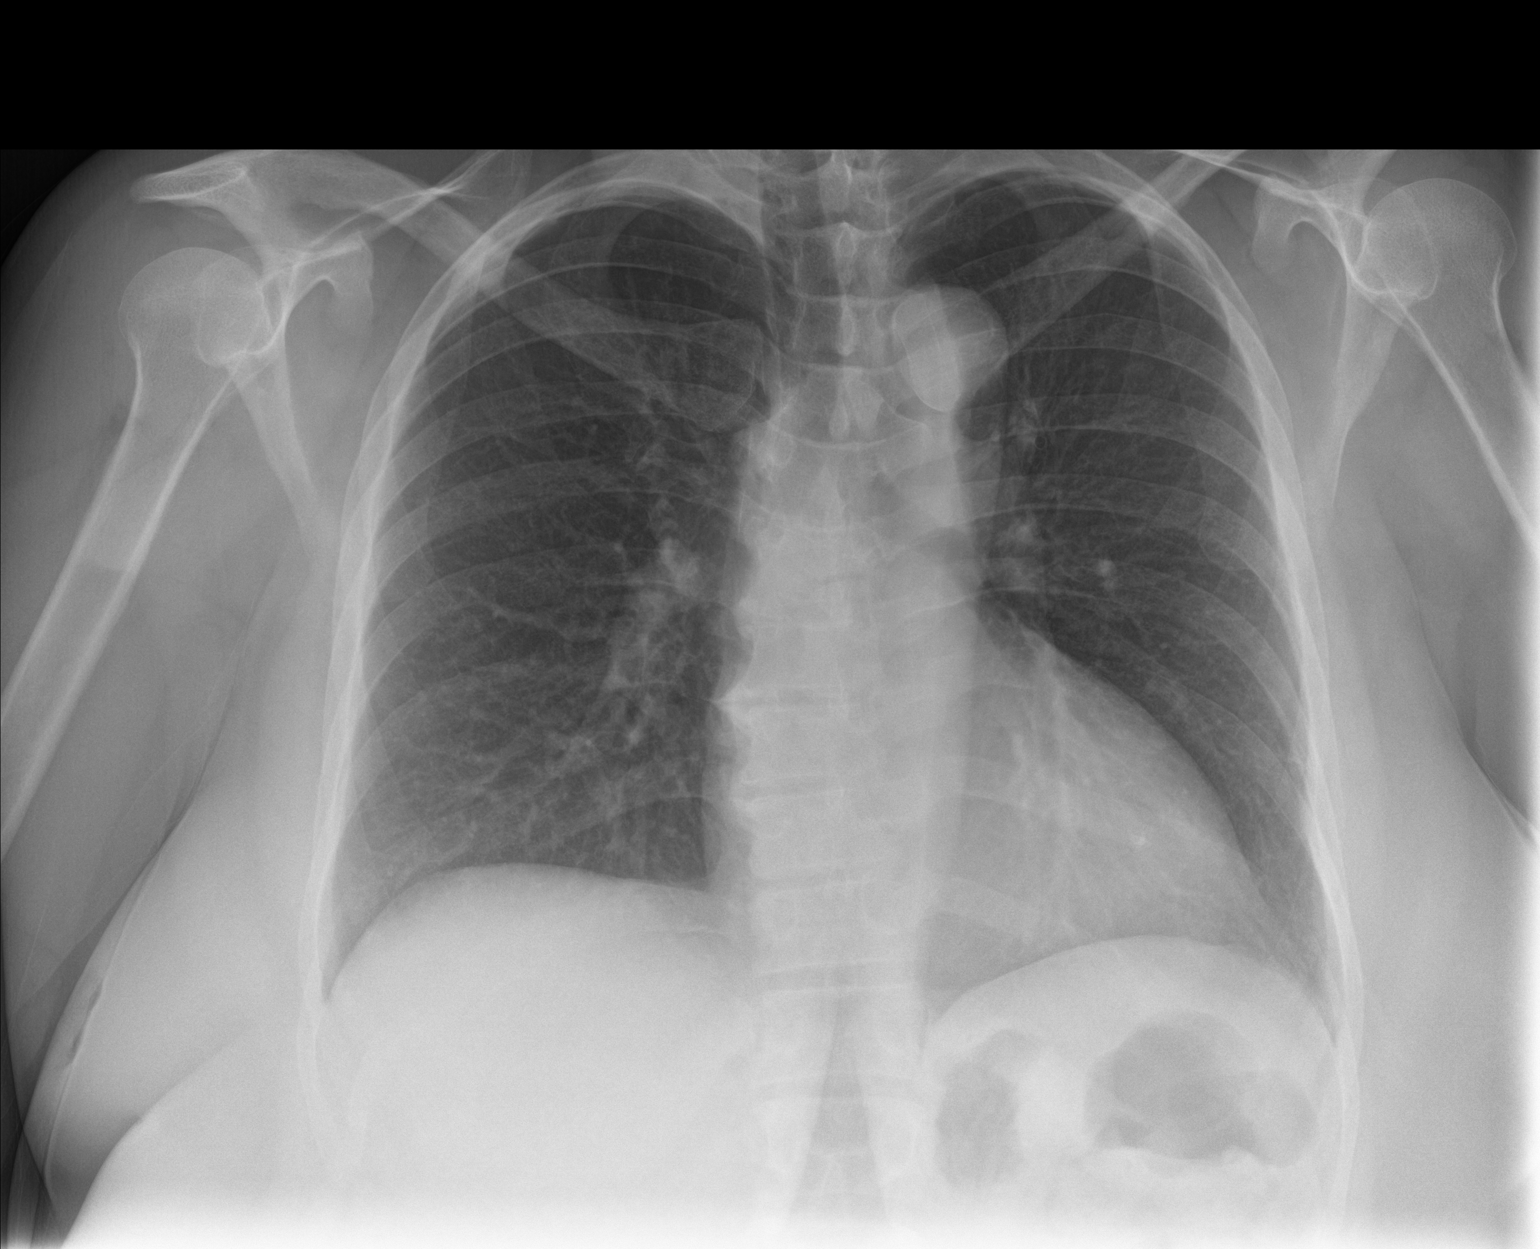

[2 of 2 positions shown; findings below may reference images not displayed]

FINDINGS: The heart size and mediastinal contours are within normal limits.
Both lungs are clear. The visualized skeletal structures are
unremarkable.
IMPRESSION: No active cardiopulmonary disease.

## 2022-01-22 ENCOUNTER — Other Ambulatory Visit: Payer: Self-pay | Admitting: Family Medicine

## 2022-01-22 DIAGNOSIS — Z1231 Encounter for screening mammogram for malignant neoplasm of breast: Secondary | ICD-10-CM

## 2022-03-08 ENCOUNTER — Ambulatory Visit: Payer: BC Managed Care – PPO

## 2022-03-08 ENCOUNTER — Ambulatory Visit
Admission: RE | Admit: 2022-03-08 | Discharge: 2022-03-08 | Disposition: A | Discharge status: Home / Self Care | Payer: BC Managed Care – PPO | Source: Ambulatory Visit | Attending: Family Medicine | Admitting: Family Medicine

## 2022-03-08 DIAGNOSIS — Z1231 Encounter for screening mammogram for malignant neoplasm of breast: Secondary | ICD-10-CM

## 2022-05-16 ENCOUNTER — Ambulatory Visit
Admission: EM | Admit: 2022-05-16 | Discharge: 2022-05-16 | Disposition: A | Discharge status: Home / Self Care | Payer: BC Managed Care – PPO

## 2022-05-16 DIAGNOSIS — J209 Acute bronchitis, unspecified: Secondary | ICD-10-CM

## 2022-05-16 DIAGNOSIS — J45901 Unspecified asthma with (acute) exacerbation: Secondary | ICD-10-CM | POA: Diagnosis not present

## 2022-05-16 DIAGNOSIS — R051 Acute cough: Secondary | ICD-10-CM

## 2022-05-16 MED ORDER — ALBUTEROL SULFATE (2.5 MG/3ML) 0.083% IN NEBU
2.5000 mg | INHALATION_SOLUTION | Freq: Once | RESPIRATORY_TRACT | Status: AC
  Administered 2022-05-16: 2.5 mg via RESPIRATORY_TRACT

## 2022-05-16 MED ORDER — AMOXICILLIN-POT CLAVULANATE 875-125 MG PO TABS: 1.0000 | ORAL_TABLET | Freq: Two times a day (BID) | ORAL | 0 refills | Status: AC

## 2022-05-16 MED ORDER — PREDNISONE 20 MG PO TABS: 40.0000 mg | ORAL_TABLET | Freq: Every day | ORAL | 0 refills | Status: AC

## 2022-05-16 MED ORDER — ALBUTEROL SULFATE (2.5 MG/3ML) 0.083% IN NEBU: 2.5000 mg | INHALATION_SOLUTION | Freq: Four times a day (QID) | RESPIRATORY_TRACT | 1 refills | Status: AC | PRN

## 2022-05-16 NOTE — Discharge Instructions (Addendum)
-  You have a chest cold and flareup of your asthma.  We have given you a breathing treatment in the clinic.  Continue albuterol at home as needed for shortness of breath and wheezing.  Tylenol as needed for chest pain but that should be improving his exacerbation is treated. - I have sent prednisone and an antibiotic to the pharmacy.  Plenty rest and fluids.  Return or go to ER if you have fever, worsening cough or increased breathing difficulty.

## 2022-05-16 NOTE — ED Triage Notes (Signed)
Pt c/o SOB, sore throat, cough x3weeks, chest and back pain x1week  Pt states that deep breaths hurt.   Pt feels like the congestion has gone down to the chest.   Pt has asthma and states her pain felt like an asthma attack.

## 2022-05-16 NOTE — ED Provider Notes (Signed)
[facility]    CSN: [id number] Arrival date & time: 05/16/22  1519      History   Chief Complaint Chief Complaint  Patient presents with   Cough   Shortness of Breath    HPI Michelle Whitehead is a 56 y.o. female senting for 3-week history of nasal congestion and cough.  Patient reports that her symptom started out with a sore throat, nasal congestion, sinus pressure and over the past week those symptoms have started to clear up but now she feels congestion in her chest and has had worsening of the cough, wheezing and some shortness of breath as well as pain in her chest when she breathes.  She does have a history of asthma and has been using her albuterol inhaler more frequently.  She denies any associated fevers.  No other complaints.  HPI  Past Medical History:  Diagnosis Date   Allergy    Asthma     Patient Active Problem List   Diagnosis Date Noted   Multifocal pneumonia 09/19/2019   COVID-19 09/19/2019    Past Surgical History:  Procedure Laterality Date   NO PAST SURGERIES      OB History   No obstetric history on file.      Home Medications    Prior to Admission medications   Medication Sig Start Date End Date Taking? Authorizing Provider  amoxicillin-clavulanate (AUGMENTIN) 875-125 MG tablet Take 1 tablet by mouth every 12 (twelve) hours for 7 days. 05/16/22 05/23/22 Yes Shirlee Latch, PA-C  predniSONE (DELTASONE) 20 MG tablet Take 2 tablets (40 mg total) by mouth daily for 5 days. 05/16/22 05/21/22 Yes Shirlee Latch, PA-C  Semaglutide-Weight Management (WEGOVY) 0.5 MG/0.5ML SOAJ inject 0.5 mL Subcutaneous once weekly for 28 days 09/01/21  Yes [provider]  albuterol (VENTOLIN HFA) 108 (90 Base) MCG/ACT inhaler Inhale 1-2 puffs into the lungs every 6 (six) hours as needed for wheezing or shortness of breath. 05/12/20   Becky Augusta, NP  meloxicam (MOBIC) 15 MG tablet Take 1 tablet (15 mg total) by mouth daily as needed for pain. 03/25/21    Tommie Sams, DO    Family History Family History  Problem Relation Age of Onset   Hypertension Mother    Hypertension Father    Heart disease Father    Diabetes Father    Heart disease Maternal Grandfather    Hypertension Paternal Grandmother    Diabetes Paternal Grandmother     Social History Social History   Tobacco Use   Smoking status: Never   Smokeless tobacco: Never  Vaping Use   Vaping Use: Never used  Substance Use Topics   Alcohol use: Yes    Alcohol/week: 3.0 standard drinks of alcohol    Types: 3 Glasses of wine per week   Drug use: No     Allergies   Patient has no known allergies.   Review of Systems Review of Systems  Constitutional:  Positive for fatigue. Negative for chills, diaphoresis and fever.  HENT:  Positive for congestion and rhinorrhea. Negative for ear pain, sinus pressure, sinus pain and sore throat.   Respiratory:  Positive for cough and shortness of breath.   Cardiovascular:  Positive for chest pain.  Gastrointestinal:  Negative for abdominal pain, nausea and vomiting.  Musculoskeletal:  Negative for arthralgias and myalgias.  Skin:  Negative for rash.  Neurological:  Negative for weakness and headaches.  Hematological:  Negative for adenopathy.     Physical Exam  Triage Vital Signs ED Triage Vitals  Enc Vitals Group     BP 05/16/22 1529 (!) 144/95     Pulse Rate 05/16/22 1529 77     Resp 05/16/22 1529 18     Temp 05/16/22 1529 98.4 F (36.9 C)     Temp Source 05/16/22 1529 Oral     SpO2 05/16/22 1529 96 %     Weight 05/16/22 1527 199 lb (90.3 kg)     Height 05/16/22 1527 5\' 6"  (1.676 m)     Head Circumference --      Peak Flow --      Pain Score 05/16/22 1526 10     Pain Loc --      Pain Edu? --      Excl. in [place]? --    No data found.  Updated Vital Signs BP (!) 144/95 (BP Location: Left Arm)   Pulse 77   Temp 98.4 F (36.9 C) (Oral)   Resp 18   Ht 5\' 6"  (1.676 m)   Wt 199 lb (90.3 kg)   SpO2 96%   BMI  32.12 kg/m    Physical Exam Vitals and nursing note reviewed.  Constitutional:      General: She is not in acute distress.    Appearance: Normal appearance. She is not ill-appearing or toxic-appearing.  HENT:     Head: Normocephalic and atraumatic.     Nose: Congestion present.     Mouth/Throat:     Mouth: Mucous membranes are moist.     Pharynx: Oropharynx is clear.  Eyes:     General: No scleral icterus.       Right eye: No discharge.        Left eye: No discharge.     Conjunctiva/sclera: Conjunctivae normal.  Cardiovascular:     Rate and Rhythm: Normal rate and regular rhythm.     Heart sounds: Normal heart sounds.  Pulmonary:     Effort: Pulmonary effort is normal. No respiratory distress.     Breath sounds: Wheezing (few scattered wheezes throughout) present.  Musculoskeletal:     Cervical back: Neck supple.  Skin:    General: Skin is dry.  Neurological:     General: No focal deficit present.     Mental Status: She is alert. Mental status is at baseline.     Motor: No weakness.     Gait: Gait normal.  Psychiatric:        Mood and Affect: Mood normal.        Behavior: Behavior normal.        Thought Content: Thought content normal.      UC Treatments / Results  Labs (all labs ordered are listed, but only abnormal results are displayed) Labs Reviewed - No data to display  EKG   Radiology No results found.  Procedures Procedures (including critical care time)  Medications Ordered in UC Medications  albuterol (PROVENTIL) (2.5 MG/3ML) 0.083% nebulizer solution 2.5 mg (2.5 mg Nebulization Given 05/16/22 1541)    Initial Impression / Assessment and Plan / UC Course  I have reviewed the triage vital signs and the nursing notes.  Pertinent labs & imaging results that were available during my care of the patient were reviewed by me and considered in my medical decision making (see chart for details).   56 year old female with history of asthma presents for  3-week history of cough and congestion.  Patient states initially symptoms started out with a lot of nasal congestion sinus  pressure as well as sore throat but those symptoms have improved and nearly resolved and now she is left with a mostly dry cough, chest pain on breathing, wheezing and shortness of breath over the past 1 week.  No associated fever.  Vitals are stable.  She is overall well-appearing and in no acute distress.  On exam she has mild nasal congestion and mild erythema posterior pharynx.  She has few scattered wheezes throughout lung fields.  No respiratory distress.  Given albuterol nebulizer treatment.  Ported improvement in symptoms.  Patient was given a nebulizer machine and I prescribed albuterol solution for her to use at home.  Suspect acute bronchitis.  Since symptoms were improving and then recently worsened, we will treat her for secondary sickening and possible bacterial infection.  Sent Augmentin and prednisone to pharmacy.  She declines cough medicine.  Advised to continue with her albuterol as needed for shortness of breath.  Return if fever, worsening cough or increased breathing difficulty.  ED precautions given.   Final Clinical Impressions(s) / UC Diagnoses   Final diagnoses:  Acute bronchitis, unspecified organism  Acute cough  Asthma with acute exacerbation, unspecified asthma severity, unspecified whether persistent     Discharge Instructions      -You have a chest cold and flareup of your asthma.  We have given you a breathing treatment in the clinic.  Continue albuterol at home as needed for shortness of breath and wheezing.  Tylenol as needed for chest pain but that should be improving his exacerbation is treated. - I have sent prednisone and an antibiotic to the pharmacy.  Plenty rest and fluids.  Return or go to ER if you have fever, worsening cough or increased breathing difficulty.     ED Prescriptions     Medication Sig Dispense Auth. Provider    amoxicillin-clavulanate (AUGMENTIN) 875-125 MG tablet Take 1 tablet by mouth every 12 (twelve) hours for 7 days. 14 tablet Eusebio Friendly B, PA-C   predniSONE (DELTASONE) 20 MG tablet Take 2 tablets (40 mg total) by mouth daily for 5 days. 10 tablet Gareth Morgan      PDMP not reviewed this encounter.   Eusebio Friendly B, PA-C 05/16/22 1600

## 2022-11-29 ENCOUNTER — Ambulatory Visit
Admission: EM | Admit: 2022-11-29 | Discharge: 2022-11-29 | Disposition: A | Discharge status: Home / Self Care | Payer: BC Managed Care – PPO | Attending: Emergency Medicine | Admitting: Emergency Medicine

## 2022-11-29 DIAGNOSIS — J020 Streptococcal pharyngitis: Secondary | ICD-10-CM | POA: Insufficient documentation

## 2022-11-29 LAB — GROUP A STREP BY PCR: Group A Strep by PCR: DETECTED — AB

## 2022-11-29 MED ORDER — AMOXICILLIN-POT CLAVULANATE 875-125 MG PO TABS: 1.0000 | ORAL_TABLET | Freq: Two times a day (BID) | ORAL | 0 refills | Status: AC

## 2022-11-29 MED ORDER — IBUPROFEN 100 MG/5ML PO SUSP
600.0000 mg | Freq: Once | ORAL | Status: AC
  Administered 2022-11-29: 600 mg via ORAL

## 2022-11-29 MED ORDER — LIDOCAINE VISCOUS HCL 2 % MT SOLN: 15.0000 mL | Freq: Four times a day (QID) | OROMUCOSAL | 0 refills | Status: DC | PRN

## 2022-11-29 NOTE — Discharge Instructions (Addendum)
Take the Augmentin twice daily for 10 days for treatment of your strep throat.  I am prescribing viscous lidocaine that you can gargle with every 6 hours as needed for pain.  Do not swallow this medication.  You can use 1 tablespoon every 6 hours as needed.  Gargle with warm salt water 2-3 times a day to soothe your throat, aid in pain relief, and aid in healing.  Take over-the-counter ibuprofen according to the package instructions as needed for pain.  You can also use Chloraseptic or Sucrets lozenges, 1 lozenge every 2 hours as needed for throat pain.  If you develop any new or worsening symptoms return for reevaluation.  If you feel as though you are developing trouble swallowing or breathing you need to call 911 and go to the ER.

## 2022-11-29 NOTE — ED Provider Notes (Signed)
[facility]    CSN: [id number] Arrival date & time: 11/29/22  0805      History   Chief Complaint Chief Complaint  Patient presents with   Sore Throat   Headache    HPI Michelle Whitehead is a 57 y.o. female.   HPI  57 year old female with a past medical history significant for asthma and allergies presents for evaluation of 2 days worth of headache and sore throat.  She does also endorse some mild nasal congestion.  She states that it is painful to swallow and speak.  She denies a cough.  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  Past Medical History:  Diagnosis Date   Allergy    Asthma     Patient Active Problem List   Diagnosis Date Noted   Multifocal pneumonia 09/19/2019   COVID-19 09/19/2019    Past Surgical History:  Procedure Laterality Date   NO PAST SURGERIES      OB History   No obstetric history on file.      Home Medications    Prior to Admission medications   Medication Sig Start Date End Date Taking? Authorizing Provider  albuterol (PROVENTIL) (2.5 MG/3ML) 0.083% nebulizer solution Take 3 mLs (2.5 mg total) by nebulization every 6 (six) hours as needed for wheezing or shortness of breath. 05/16/22  Yes Shirlee Latch, PA-C  albuterol (VENTOLIN HFA) 108 (90 Base) MCG/ACT inhaler Inhale 1-2 puffs into the lungs every 6 (six) hours as needed for wheezing or shortness of breath. 05/12/20  Yes Becky Augusta, NP  amoxicillin-clavulanate (AUGMENTIN) 875-125 MG tablet Take 1 tablet by mouth every 12 (twelve) hours for 10 days. 11/29/22 12/09/22 Yes Becky Augusta, NP  lidocaine (XYLOCAINE) 2 % solution Use as directed 15 mLs in the mouth or throat every 6 (six) hours as needed for mouth pain. 11/29/22  Yes Becky Augusta, NP    Family History Family History  Problem Relation Age of Onset   Hypertension Mother    Hypertension Father    Heart disease Father     Diabetes Father    Heart disease Maternal Grandfather    Hypertension Paternal Grandmother    Diabetes Paternal Grandmother     Social History Social History   Tobacco Use   Smoking status: Never   Smokeless tobacco: Never  Vaping Use   Vaping Use: Never used  Substance Use Topics   Alcohol use: Yes    Alcohol/week: 3.0 standard drinks of alcohol    Types: 3 Glasses of wine per week   Drug use: No     Allergies   Patient has no known allergies.   Review of Systems Review of Systems  Constitutional:  Positive for fever.  HENT:  Positive for congestion and sore throat. Negative for rhinorrhea.   Respiratory:  Negative for cough.      Physical Exam Triage Vital Signs ED Triage Vitals [11/29/22 0811]  Enc Vitals Group     BP      Pulse      Resp 16     Temp      Temp Source Oral     SpO2      Weight      Height      Head Circumference      Peak Flow      Pain Score      Pain Loc  Pain Edu?      Excl. in GC?    No data found.  Updated Vital Signs BP 135/86 (BP Location: Right Arm)   Pulse (!) 135   Temp (!) 100.4 F (38 C) (Oral)   Resp 16   Ht 5\' 6"  (1.676 m)   Wt 189 lb (85.7 kg)   SpO2 96%   BMI 30.51 kg/m   Visual Acuity Right Eye Distance:   Left Eye Distance:   Bilateral Distance:    Right Eye Near:   Left Eye Near:    Bilateral Near:     Physical Exam Vitals and nursing note reviewed.  Constitutional:      Appearance: Normal appearance. She is not ill-appearing.  HENT:     Head: Normocephalic and atraumatic.     Mouth/Throat:     Mouth: Mucous membranes are moist.     Pharynx: Oropharyngeal exudate and posterior oropharyngeal erythema present.     Comments: Bilateral tonsillar pillars are 2+ erythematous and edematous with white exudate. Neck:     Comments: Bilateral tender anterior cervical lymphadenopathy. Cardiovascular:     Rate and Rhythm: Normal rate and regular rhythm.     Pulses: Normal pulses.     Heart sounds:  Normal heart sounds. No murmur heard.    No friction rub. No gallop.  Pulmonary:     Effort: Pulmonary effort is normal.     Breath sounds: Normal breath sounds. No wheezing, rhonchi or rales.  Musculoskeletal:     Cervical back: Normal range of motion and neck supple. Tenderness present.  Lymphadenopathy:     Cervical: Cervical adenopathy present.  Skin:    General: Skin is warm and dry.     Capillary Refill: Capillary refill takes less than 2 seconds.     Findings: No erythema or rash.  Neurological:     Mental Status: She is alert.      UC Treatments / Results  Labs (all labs ordered are listed, but only abnormal results are displayed) Labs Reviewed  GROUP A STREP BY PCR - Abnormal; Notable for the following components:      Result Value   Group A Strep by PCR DETECTED (*)    All other components within normal limits    EKG   Radiology No results found.  Procedures Procedures (including critical care time)  Medications Ordered in UC Medications  ibuprofen (ADVIL) 100 MG/5ML suspension 600 mg (600 mg Oral Given 11/29/22 0905)    Initial Impression / Assessment and Plan / UC Course  I have reviewed the triage vital signs and the nursing notes.  Pertinent labs & imaging results that were available during my care of the patient were reviewed by me and considered in my medical decision making (see chart for details).   Patient is a pleasant 57 year old female who does appear to be in moderate degree of pain presenting for evaluation of 2 days worth of headache, fever, and sore throat.  She did not measure her fever at home but she has a temperature of 100.4 here in clinic.  She is also mildly tachycardic at 135.  She is able to speak and swallow her secretions though she states that speaking and swallowing are very painful for her.  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  On exam she does  have erythematous edematous tonsillar pillars with white exudate.  Also anterior cervical lymphadenopathy  that is tender.  I will order a strep PCR for evaluation of possible strep pharyngitis.  I will also order 600 mg of liquid ibuprofen for the patient's pain.  Strep PCR is positive.  I will discharge patient home on Augmentin 875 twice daily for 10 days for treatment of strep pharyngitis.  I will also order some viscous lidocaine that she can gargle with prior to meals and at bedtime as needed for pain.  Over-the-counter Tylenol and/or ibuprofen as needed for pain along with salt water gargles.  Over-the-counter Chloraseptic and Sucrets lozenges may also be helpful with pain relief.  ER and return precautions reviewed.   Final Clinical Impressions(s) / UC Diagnoses   Final diagnoses:  Strep pharyngitis     Discharge Instructions      Take the Augmentin twice daily for 10 days for treatment of your strep throat.  I am prescribing viscous lidocaine that you can gargle with every 6 hours as needed for pain.  Do not swallow this medication.  You can use 1 tablespoon every 6 hours as needed.  Gargle with warm salt water 2-3 times a day to soothe your throat, aid in pain relief, and aid in healing.  Take over-the-counter ibuprofen according to the package instructions as needed for pain.  You can also use Chloraseptic or Sucrets lozenges, 1 lozenge every 2 hours as needed for throat pain.  If you develop any new or worsening symptoms return for reevaluation.  If you feel as though you are developing trouble swallowing or breathing you need to call 911 and go to the ER.     ED Prescriptions     Medication Sig Dispense Auth. Provider   amoxicillin-clavulanate (AUGMENTIN) 875-125 MG tablet Take 1 tablet by mouth every 12 (twelve) hours for 10 days. 20 tablet Becky Augusta, NP   lidocaine (XYLOCAINE) 2 % solution Use as directed 15 mLs in the mouth or throat every 6 (six) hours as needed  for mouth pain. 100 mL Becky Augusta, NP      PDMP not reviewed this encounter.   Becky Augusta, NP 11/29/22 [phone]

## 2022-11-29 NOTE — ED Triage Notes (Signed)
Pt c/o HA,sore throat x2 days. Denies any fevers. Has tried nyquil w/o relief, last dose around 2200 last night.

## 2022-12-02 ENCOUNTER — Ambulatory Visit
Admission: EM | Admit: 2022-12-02 | Discharge: 2022-12-02 | Disposition: A | Discharge status: Home / Self Care | Payer: BC Managed Care – PPO | Attending: Emergency Medicine | Admitting: Emergency Medicine

## 2022-12-02 DIAGNOSIS — R590 Localized enlarged lymph nodes: Secondary | ICD-10-CM

## 2022-12-02 DIAGNOSIS — R051 Acute cough: Secondary | ICD-10-CM | POA: Diagnosis not present

## 2022-12-02 MED ORDER — DEXAMETHASONE SODIUM PHOSPHATE 10 MG/ML IJ SOLN
10.0000 mg | Freq: Once | INTRAMUSCULAR | Status: AC
  Administered 2022-12-02: 10 mg via INTRAMUSCULAR

## 2022-12-02 NOTE — ED Triage Notes (Signed)
Pt c/o facial swelling,cough & emesis w/mucus x4 days. Was seen on 5/13 & tested + for strep. Was given augmentin & xylocaine w/o relief, states she maybe having a rxn to xylocaine, still taking abx.

## 2022-12-02 NOTE — Discharge Instructions (Addendum)
Continue your Augmentin as previously prescribed.  We have given you a shot of steroids in clinic that will help with the pain and fullness you are feeling in your jaw and neck.  If you develop any new or worsening symptoms please return for reevaluation or see your primary care provider.

## 2022-12-02 NOTE — ED Provider Notes (Signed)
[facility]    CSN: [id number] Arrival date & time: 12/02/22  1453      History   Chief Complaint Chief Complaint  Patient presents with   Facial Swelling   Cough   Emesis   Medication Reaction    HPI JOHNELLA KOLIN is a 57 y.o. female.   HPI  57 year old female with a past medical history significant for asthma and allergies presents for facial swelling, cough and emesis.  She reports that she feels like the area underneath her jaw and anterior neck are swollen and that her cheeks on the inside are also tender to touch.  She has not had any shortness of breath, wheezing, difficulty swallowing, or rashes.  She reports that her sore throat pain has improved.  She is concerned it might be an allergic reaction to Augmentin or Xylocaine.  She reports that her cough is productive for yellow sputum.  Past Medical History:  Diagnosis Date   Allergy    Asthma     Patient Active Problem List   Diagnosis Date Noted   Multifocal pneumonia 09/19/2019   COVID-19 09/19/2019    Past Surgical History:  Procedure Laterality Date   NO PAST SURGERIES      OB History   No obstetric history on file.      Home Medications    Prior to Admission medications   Medication Sig Start Date End Date Taking? Authorizing Provider  albuterol (PROVENTIL) (2.5 MG/3ML) 0.083% nebulizer solution Take 3 mLs (2.5 mg total) by nebulization every 6 (six) hours as needed for wheezing or shortness of breath. 05/16/22   Shirlee Latch, PA-C  albuterol (VENTOLIN HFA) 108 (90 Base) MCG/ACT inhaler Inhale 1-2 puffs into the lungs every 6 (six) hours as needed for wheezing or shortness of breath. 05/12/20   Becky Augusta, NP  amoxicillin-clavulanate (AUGMENTIN) 875-125 MG tablet Take 1 tablet by mouth every 12 (twelve) hours for 10 days. 11/29/22 12/09/22  Becky Augusta, NP  lidocaine (XYLOCAINE) 2 % solution Use as directed 15 mLs in the mouth or throat every 6 (six) hours as needed for mouth pain.  11/29/22   Becky Augusta, NP    Family History Family History  Problem Relation Age of Onset   Hypertension Mother    Hypertension Father    Heart disease Father    Diabetes Father    Heart disease Maternal Grandfather    Hypertension Paternal Grandmother    Diabetes Paternal Grandmother     Social History Social History   Tobacco Use   Smoking status: Never   Smokeless tobacco: Never  Vaping Use   Vaping Use: Never used  Substance Use Topics   Alcohol use: Yes    Alcohol/week: 3.0 standard drinks of alcohol    Types: 3 Glasses of wine per week   Drug use: No     Allergies   Patient has no known allergies.   Review of Systems Review of Systems  Constitutional:  Negative for fever.  HENT:  Positive for facial swelling. Negative for sore throat.   Respiratory:  Positive for cough. Negative for shortness of breath and wheezing.   Gastrointestinal:  Positive for vomiting.  Skin:  Negative for rash.     Physical Exam Triage Vital Signs ED Triage Vitals  Enc Vitals Group     BP --      Pulse --      Resp 12/02/22 1530 16     Temp --  Temp Source 12/02/22 1530 Oral     SpO2 --      Weight 12/02/22 1529 189 lb (85.7 kg)     Height 12/02/22 1529 5\' 6"  (1.676 m)     Head Circumference --      Peak Flow --      Pain Score 12/02/22 1529 0     Pain Loc --      Pain Edu? --      Excl. in GC? --    No data found.  Updated Vital Signs BP (!) 140/97 (BP Location: Left Arm)   Pulse 90   Temp 98.8 F (37.1 C) (Oral)   Resp 16   Ht 5\' 6"  (1.676 m)   Wt 189 lb (85.7 kg)   SpO2 98%   BMI 30.51 kg/m   Visual Acuity Right Eye Distance:   Left Eye Distance:   Bilateral Distance:    Right Eye Near:   Left Eye Near:    Bilateral Near:     Physical Exam Vitals and nursing note reviewed.  Constitutional:      Appearance: Normal appearance. She is not ill-appearing.  HENT:     Head: Normocephalic and atraumatic.     Nose: Congestion and rhinorrhea  present.     Comments: Erythematous edematous nasal mucosa with clear discharge in both nares.    Mouth/Throat:     Mouth: Mucous membranes are moist.     Pharynx: Oropharynx is clear. No oropharyngeal exudate or posterior oropharyngeal erythema.     Comments: There is no erythema, erosions, or lesions on the buccal surfaces of either cheek.  Patient does have some submandibular and submental tenderness and mild fullness without fluctuance or induration.  No erythema to the overlying skin. Neck:     Comments: Bilateral anterior cervical of adenopathy with tenderness present on exam. Cardiovascular:     Rate and Rhythm: Normal rate and regular rhythm.     Pulses: Normal pulses.     Heart sounds: Normal heart sounds. No murmur heard.    No friction rub. No gallop.  Pulmonary:     Effort: Pulmonary effort is normal.     Breath sounds: Normal breath sounds. No stridor. No wheezing, rhonchi or rales.  Musculoskeletal:     Cervical back: Normal range of motion and neck supple. Tenderness present.  Lymphadenopathy:     Cervical: Cervical adenopathy present.  Skin:    General: Skin is warm and dry.     Capillary Refill: Capillary refill takes less than 2 seconds.     Findings: No erythema or rash.  Neurological:     General: No focal deficit present.     Mental Status: She is alert and oriented to person, place, and time.      UC Treatments / Results  Labs (all labs ordered are listed, but only abnormal results are displayed) Labs Reviewed - No data to display  EKG   Radiology No results found.  Procedures Procedures (including critical care time)  Medications Ordered in UC Medications  dexamethasone (DECADRON) injection 10 mg (has no administration in time range)    Initial Impression / Assessment and Plan / UC Course  I have reviewed the triage vital signs and the nursing notes.  Pertinent labs & imaging results that were available during my care of the patient were  reviewed by me and considered in my medical decision making (see chart for details).   Patient is a nontoxic-appearing 57 year old female presenting for evaluation of  what she is calling facial swelling.  On exam she does not have any swelling to her facial structures but she states that she feels like she is swollen in her submental/submandibular area.  She does not have any trouble swallowing or shortness of breath.  Her oropharyngeal airway is clear and she has no stridor when auscultating over the trachea.  She does have some submental and submandibular fullness and lymphadenopathy present, along with anterior cervical lymphadenopathy.  She is on Augmentin for strep throat and she reports that her throat feels better and her throat is less red on exam today.  She states she feels like the inside of her cheeks are sore to touch with her tongue but there is no lesions or erosions noted.  She also endorses that she has had a cough that is productive for yellow sputum intermittently.  She is not coughing in the exam room.  When she presented for evaluation initially several days ago she ate did have some nasal congestion and she continues to have inflamed nasal mucosa but her discharge appears clear.  I suspect that her cough is a result of postnasal drip.  I have offered her a shot of Decadron here in clinic and she is excepted, to help with the tenderness from the cervical of adenopathy as well as the submental and submandibular lymphadenopathy.  I have advised her to continue taking her Augmentin as previously prescribed if symptoms develop she can return for reevaluation or   Final Clinical Impressions(s) / UC Diagnoses   Final diagnoses:  Acute cough  Submandibular lymphadenopathy     Discharge Instructions      Continue your Augmentin as previously prescribed.  We have given you a shot of steroids in clinic that will help with the pain and fullness you are feeling in your jaw and neck.  If  you develop any new or worsening symptoms please return for reevaluation or see your primary care provider.     ED Prescriptions   None    PDMP not reviewed this encounter.   Becky Augusta, NP 12/02/22 [phone]

## 2023-01-24 ENCOUNTER — Other Ambulatory Visit: Payer: Self-pay | Admitting: Family Medicine

## 2023-01-24 DIAGNOSIS — Z1231 Encounter for screening mammogram for malignant neoplasm of breast: Secondary | ICD-10-CM

## 2023-03-11 ENCOUNTER — Ambulatory Visit: Payer: BC Managed Care – PPO

## 2023-03-25 ENCOUNTER — Ambulatory Visit
Admission: RE | Admit: 2023-03-25 | Discharge: 2023-03-25 | Disposition: A | Discharge status: Home / Self Care | Payer: BC Managed Care – PPO | Source: Ambulatory Visit | Attending: Family Medicine | Admitting: Family Medicine

## 2023-03-25 DIAGNOSIS — Z1231 Encounter for screening mammogram for malignant neoplasm of breast: Secondary | ICD-10-CM

## 2023-09-27 ENCOUNTER — Encounter: Payer: Self-pay | Admitting: Emergency Medicine

## 2023-09-27 ENCOUNTER — Ambulatory Visit
Admission: EM | Admit: 2023-09-27 | Discharge: 2023-09-27 | Disposition: A | Discharge status: Home / Self Care | Attending: Emergency Medicine | Admitting: Emergency Medicine

## 2023-09-27 ENCOUNTER — Ambulatory Visit (INDEPENDENT_AMBULATORY_CARE_PROVIDER_SITE_OTHER)

## 2023-09-27 DIAGNOSIS — R051 Acute cough: Secondary | ICD-10-CM | POA: Diagnosis not present

## 2023-09-27 DIAGNOSIS — J0100 Acute maxillary sinusitis, unspecified: Secondary | ICD-10-CM | POA: Diagnosis not present

## 2023-09-27 DIAGNOSIS — J4541 Moderate persistent asthma with (acute) exacerbation: Secondary | ICD-10-CM

## 2023-09-27 MED ORDER — FLUTICASONE PROPIONATE 50 MCG/ACT NA SUSP: 2.0000 | Freq: Every day | NASAL | 0 refills | Status: AC

## 2023-09-27 MED ORDER — AEROCHAMBER MV MISC: 1 refills | Status: AC

## 2023-09-27 MED ORDER — ALBUTEROL SULFATE HFA 108 (90 BASE) MCG/ACT IN AERS: 2.0000 | INHALATION_SPRAY | RESPIRATORY_TRACT | 0 refills | Status: AC | PRN

## 2023-09-27 MED ORDER — PREDNISONE 50 MG PO TABS: 50.0000 mg | ORAL_TABLET | Freq: Every day | ORAL | 0 refills | Status: AC

## 2023-09-27 MED ORDER — HYDROCOD POLI-CHLORPHE POLI ER 10-8 MG/5ML PO SUER: 5.0000 mL | Freq: Two times a day (BID) | ORAL | 0 refills | Status: AC | PRN

## 2023-09-27 NOTE — ED Triage Notes (Signed)
 Pt presents with a cough, runny nose, diarrhea, back and chest pain x 1 week.

## 2023-09-27 NOTE — ED Provider Notes (Signed)
 HPI  SUBJECTIVE:  Michelle Whitehead is a 58 y.o. female who presents with 1-1/2 weeks of dry cough, chest soreness from coughing, thoracic soreness, thick copious Rhinorrhea/nasal congestion, postnasal drip, wheezing, shortness of breath and mild dyspnea on exertion.  She reports sinus pain and pressure, but states that this has resolved.  She reports fevers of 100.5 at the beginning of the illness, but has been afebrile since.  She states that she got better and then got worse.  She is unable to sleep at night because of the cough.  No antibiotics in the past 3 months.  No antipyretic in the past 6 hours.  She has been taking Mucinex, DayQuil, NyQuil, and has used her albuterol twice with improvement in her chest soreness.  Symptoms are worse with lying down.  She has a past medical history of COVID-pneumonia, asthma with no admissions.  [facility] [place]    Past Medical History:  Diagnosis Date   Allergy    Asthma     Past Surgical History:  Procedure Laterality Date   NO PAST SURGERIES      Family History  Problem Relation Age of Onset   Hypertension Mother    Hypertension Father    Heart disease Father    Diabetes Father    Heart disease Maternal Grandfather    Hypertension Paternal Grandmother    Diabetes Paternal Grandmother     Social History   Tobacco Use   Smoking status: Never   Smokeless tobacco: Never  Vaping Use   Vaping status: Never Used  Substance Use Topics   Alcohol use: Yes    Alcohol/week: 3.0 standard drinks of alcohol    Types: 3 Glasses of wine per week   Drug use: No    No current facility-administered medications for this encounter.  Current Outpatient Medications:    albuterol (VENTOLIN HFA) 108 (90 Base) MCG/ACT inhaler, Inhale 2 puffs into the lungs every 4 (four) hours as needed for wheezing or shortness of breath., Disp: 1 each, Rfl: 0   chlorpheniramine-HYDROcodone (TUSSIONEX) 10-8 MG/5ML, Take 5 mLs by mouth every 12  (twelve) hours as needed for cough., Disp: 60 mL, Rfl: 0   fluticasone (FLONASE) 50 MCG/ACT nasal spray, Place 2 sprays into both nostrils daily., Disp: 16 g, Rfl: 0   phentermine (ADIPEX-P) 37.5 MG tablet, Take by mouth., Disp: , Rfl:    predniSONE (DELTASONE) 50 MG tablet, Take 1 tablet (50 mg total) by mouth daily with breakfast., Disp: 5 tablet, Rfl: 0   Spacer/Aero-Holding Chambers (AEROCHAMBER MV) inhaler, Use as instructed, Disp: 1 each, Rfl: 1   albuterol (PROVENTIL) (2.5 MG/3ML) 0.083% nebulizer solution, Take 3 mLs (2.5 mg total) by nebulization every 6 (six) hours as needed for wheezing or shortness of breath., Disp: 75 mL, Rfl: 1  No Known Allergies   ROS  As noted in HPI.   Physical Exam  BP 126/89 (BP Location: Left Arm)   Pulse 91   Temp 98.5 F (36.9 C) (Oral)   Resp 18   SpO2 99%   Constitutional: Well developed, well nourished, no acute distress Eyes:  EOMI, conjunctiva normal bilaterally HENT: Normocephalic, atraumatic,mucus membranes moist.  Purulent nasal congestion.  Normal turbinates.  Positive maxillary sinus tenderness.  No frontal sinus tenderness.  Positive postnasal drip. Respiratory: Normal inspiratory effort lungs clear bilaterally.  Prolonged expiratory phase.  Fair air movement.  Positive anterior, lateral chest wall tenderness. Cardiovascular: Normal rate, regular rhythm, no murmurs rubs or gallops GI:  nondistended skin: No rash, skin intact Musculoskeletal: no deformities Neurologic: Alert & oriented x 3, no focal neuro deficits Psychiatric: Speech and behavior appropriate   ED Course   Medications - No data to display  Orders Placed This Encounter  Procedures   DG Chest 2 View    Standing Status:   Standing    Number of Occurrences:   1    Reason for Exam (SYMPTOM  OR DIAGNOSIS REQUIRED):   Cough for 1 week, rule out pneumonia    No results found for this or any previous visit (from the past 24 hours). DG Chest 2 View Result Date:  09/27/2023 CLINICAL DATA:  Cough for 1 week. EXAM: CHEST - 2 VIEW COMPARISON:  Radiograph 05/12/2020 FINDINGS: The cardiomediastinal contours are normal. The lungs are clear. Pulmonary vasculature is normal. No consolidation, pleural effusion, or pneumothorax. No acute osseous abnormalities are seen. IMPRESSION: No active cardiopulmonary disease. Electronically Signed   By: Narda Rutherford M.D.   On: 09/27/2023 15:30    ED Clinical Impression  1. Acute non-recurrent maxillary sinusitis   2. Acute cough   3. Moderate persistent asthma with acute exacerbation      ED Assessment/Plan     [place] Narcotic database reviewed for this patient, and feel that the risk/benefit ratio today is favorable for proceeding with a prescription for controlled substance.  Reviewed imaging independently.  Increased bronchial markings as read by me.  No consolidation. Formal radiology overread pending.  Discussed with the patient.  Will contact patient at [phone] if radiology overread differs enough from mine and we need to change management.  Reviewed radiology report.  No pneumonia consistent with my read.  See report for details  Sensation consistent with URI with secondary sinusitis and asthma exacerbation.  Home with Augmentin for 7 days, prednisone 50 mg for 5 days, regularly scheduled albuterol inhaler with a spacer for 4 days, then as needed thereafter, Tussionex, saline nasal irrigation, Flonase.  Follow-up with PCP as needed.  Discussed with patient that cough can last for several more weeks.  Discussed imaging, MDM, treatment plan, and plan for follow-up with patient. patient agrees with plan.   Meds ordered this encounter  Medications   albuterol (VENTOLIN HFA) 108 (90 Base) MCG/ACT inhaler    Sig: Inhale 2 puffs into the lungs every 4 (four) hours as needed for wheezing or shortness of breath.    Dispense:  1 each    Refill:  0   predniSONE (DELTASONE) 50 MG tablet    Sig: Take 1 tablet (50  mg total) by mouth daily with breakfast.    Dispense:  5 tablet    Refill:  0   Spacer/Aero-Holding Chambers (AEROCHAMBER MV) inhaler    Sig: Use as instructed    Dispense:  1 each    Refill:  1   chlorpheniramine-HYDROcodone (TUSSIONEX) 10-8 MG/5ML    Sig: Take 5 mLs by mouth every 12 (twelve) hours as needed for cough.    Dispense:  60 mL    Refill:  0   fluticasone (FLONASE) 50 MCG/ACT nasal spray    Sig: Place 2 sprays into both nostrils daily.    Dispense:  16 g    Refill:  0      *This clinic note was created using Scientist, clinical (histocompatibility and immunogenetics). Therefore, there may be occasional mistakes despite careful proofreading.  ?    Domenick Gong, MD 09/30/23 1127

## 2023-09-27 NOTE — Discharge Instructions (Signed)
 Take two puffs from your albuterol inhaler with your spacer every 4 hours for 2 days, then every 6 hours for 2 days, then as needed. You can back off if you start to improve  sooner. Finish the steroids unless your doctor tells you to stop. Finish the antibiotics, even if you feel better.  Take tylenol 1 gram combined with  600 mg of motrin up to 3-4 times a day as needed for pain. Make sure you drink extra fluids. Return to the ER if you get worse, have a fever >100.4, or any other concerns. \  Continue Mucinex, start saline nasal irrigation with a NeilMed sinus rinse and distilled water as often as you want.  Flonase for the postnasal drip.  If the spacer is too expensive at the pharmacy, you can get an AeroChamber Z-Stat off of [employer] for about $10-$15.  Go to www.goodrx.com  or www.costplusdrugs.com to look up your medications. This will give you a list of where you can find your prescriptions at the most affordable prices. Or ask the pharmacist what the cash price is, or if they have any other discount programs available to help make your medication more affordable. This can be less expensive than what you would pay with insurance.

## 2024-05-09 ENCOUNTER — Other Ambulatory Visit: Payer: Self-pay | Admitting: Family Medicine

## 2024-05-09 DIAGNOSIS — Z1231 Encounter for screening mammogram for malignant neoplasm of breast: Secondary | ICD-10-CM

## 2024-05-29 ENCOUNTER — Ambulatory Visit
Admission: RE | Admit: 2024-05-29 | Discharge: 2024-05-29 | Disposition: A | Source: Ambulatory Visit | Attending: Family Medicine | Admitting: Family Medicine

## 2024-05-29 DIAGNOSIS — Z1231 Encounter for screening mammogram for malignant neoplasm of breast: Secondary | ICD-10-CM
